# Patient Record
Sex: Female | Born: 1975
Health system: Southern US, Community
[De-identification: ages and names within clinical notes are randomized; demographics above are authoritative.]

## PROBLEM LIST (undated history)

## (undated) DIAGNOSIS — Z8249 Family history of ischemic heart disease and other diseases of the circulatory system: Secondary | ICD-10-CM

## (undated) DIAGNOSIS — B009 Herpesviral infection, unspecified: Secondary | ICD-10-CM

## (undated) DIAGNOSIS — F419 Anxiety disorder, unspecified: Secondary | ICD-10-CM

## (undated) DIAGNOSIS — G43109 Migraine with aura, not intractable, without status migrainosus: Secondary | ICD-10-CM

## (undated) DIAGNOSIS — R030 Elevated blood-pressure reading, without diagnosis of hypertension: Principal | ICD-10-CM

## (undated) DIAGNOSIS — N200 Calculus of kidney: Secondary | ICD-10-CM

## (undated) DIAGNOSIS — R519 Headache, unspecified: Secondary | ICD-10-CM

## (undated) DIAGNOSIS — IMO0001 Reserved for inherently not codable concepts without codable children: Secondary | ICD-10-CM

## (undated) DIAGNOSIS — R51 Headache: Secondary | ICD-10-CM

## (undated) HISTORY — DX: Herpesviral infection, unspecified: B00.9

## (undated) HISTORY — PX: ANTERIOR CRUCIATE LIGAMENT REPAIR: SHX115

## (undated) HISTORY — DX: Headache, unspecified: R51.9

## (undated) HISTORY — DX: Headache: R51

## (undated) HISTORY — DX: Reserved for inherently not codable concepts without codable children: IMO0001

## (undated) HISTORY — DX: Family history of ischemic heart disease and other diseases of the circulatory system: Z82.49

## (undated) HISTORY — DX: Anxiety disorder, unspecified: F41.9

## (undated) HISTORY — DX: Elevated blood-pressure reading, without diagnosis of hypertension: R03.0

## (undated) HISTORY — PX: LEEP: SHX91

## (undated) HISTORY — DX: Calculus of kidney: N20.0

## (undated) HISTORY — DX: Migraine with aura, not intractable, without status migrainosus: G43.109

---

## 1998-04-30 ENCOUNTER — Other Ambulatory Visit: Admission: RE | Admit: 1998-04-30 | Discharge: 1998-04-30 | Payer: Self-pay | Admitting: Gynecology

## 1998-05-25 ENCOUNTER — Ambulatory Visit (HOSPITAL_COMMUNITY): Admission: RE | Admit: 1998-05-25 | Discharge: 1998-05-25 | Payer: Self-pay | Admitting: Gynecology

## 2000-05-09 ENCOUNTER — Emergency Department (HOSPITAL_COMMUNITY): Admission: EM | Admit: 2000-05-09 | Discharge: 2000-05-09 | Payer: Self-pay | Admitting: Emergency Medicine

## 2001-04-15 ENCOUNTER — Other Ambulatory Visit: Admission: RE | Admit: 2001-04-15 | Discharge: 2001-04-15 | Payer: Self-pay | Admitting: Obstetrics and Gynecology

## 2002-04-28 ENCOUNTER — Other Ambulatory Visit: Admission: RE | Admit: 2002-04-28 | Discharge: 2002-04-28 | Payer: Self-pay | Admitting: Obstetrics and Gynecology

## 2002-04-28 ENCOUNTER — Encounter: Admission: RE | Admit: 2002-04-28 | Discharge: 2002-04-28 | Payer: Self-pay | Admitting: Family Medicine

## 2002-04-28 ENCOUNTER — Encounter: Payer: Self-pay | Admitting: Family Medicine

## 2003-05-16 ENCOUNTER — Other Ambulatory Visit: Admission: RE | Admit: 2003-05-16 | Discharge: 2003-05-16 | Payer: Self-pay | Admitting: Obstetrics and Gynecology

## 2004-10-23 ENCOUNTER — Other Ambulatory Visit: Admission: RE | Admit: 2004-10-23 | Discharge: 2004-10-23 | Payer: Self-pay | Admitting: Obstetrics and Gynecology

## 2004-10-26 ENCOUNTER — Emergency Department (HOSPITAL_COMMUNITY): Admission: EM | Admit: 2004-10-26 | Discharge: 2004-10-26 | Payer: Self-pay | Admitting: *Deleted

## 2008-02-04 ENCOUNTER — Ambulatory Visit (HOSPITAL_BASED_OUTPATIENT_CLINIC_OR_DEPARTMENT_OTHER): Admission: RE | Admit: 2008-02-04 | Discharge: 2008-02-04 | Payer: Self-pay | Admitting: Orthopedic Surgery

## 2010-05-13 ENCOUNTER — Inpatient Hospital Stay (HOSPITAL_COMMUNITY): Admission: AD | Admit: 2010-05-13 | Discharge: 2010-05-19 | Payer: Self-pay | Admitting: Obstetrics and Gynecology

## 2010-05-15 ENCOUNTER — Encounter (INDEPENDENT_AMBULATORY_CARE_PROVIDER_SITE_OTHER): Payer: Self-pay | Admitting: Obstetrics and Gynecology

## 2010-05-21 ENCOUNTER — Encounter: Admission: RE | Admit: 2010-05-21 | Discharge: 2010-06-04 | Payer: Self-pay | Admitting: Certified Nurse Midwife

## 2010-11-28 LAB — CBC
HCT: 30.2 % — ABNORMAL LOW (ref 36.0–46.0)
Hemoglobin: 10.2 g/dL — ABNORMAL LOW (ref 12.0–15.0)
MCH: 28.4 pg (ref 26.0–34.0)
MCHC: 33.7 g/dL (ref 30.0–36.0)
MCV: 84.2 fL (ref 78.0–100.0)
Platelets: 259 10*3/uL (ref 150–400)
RBC: 3.59 MIL/uL — ABNORMAL LOW (ref 3.87–5.11)
RDW: 16.7 % — ABNORMAL HIGH (ref 11.5–15.5)
WBC: 13 10*3/uL — ABNORMAL HIGH (ref 4.0–10.5)

## 2010-11-29 LAB — SAMPLE TO BLOOD BANK

## 2010-11-29 LAB — DIFFERENTIAL
Basophils Absolute: 0 10*3/uL (ref 0.0–0.1)
Basophils Relative: 0 % (ref 0–1)
Eosinophils Absolute: 0.1 10*3/uL (ref 0.0–0.7)
Eosinophils Relative: 1 % (ref 0–5)
Lymphocytes Relative: 27 % (ref 12–46)
Lymphs Abs: 3.6 10*3/uL (ref 0.7–4.0)
Monocytes Absolute: 0.9 10*3/uL (ref 0.1–1.0)
Monocytes Relative: 7 % (ref 3–12)
Neutro Abs: 8.7 10*3/uL — ABNORMAL HIGH (ref 1.7–7.7)
Neutrophils Relative %: 65 % (ref 43–77)

## 2010-11-29 LAB — CBC
HCT: 35.4 % — ABNORMAL LOW (ref 36.0–46.0)
Hemoglobin: 11.8 g/dL — ABNORMAL LOW (ref 12.0–15.0)
MCH: 27.9 pg (ref 26.0–34.0)
MCHC: 33.3 g/dL (ref 30.0–36.0)
MCV: 83.6 fL (ref 78.0–100.0)
Platelets: 282 10*3/uL (ref 150–400)
RBC: 4.23 MIL/uL (ref 3.87–5.11)
RDW: 16.5 % — ABNORMAL HIGH (ref 11.5–15.5)
WBC: 13.3 10*3/uL — ABNORMAL HIGH (ref 4.0–10.5)

## 2010-11-29 LAB — RPR: RPR Ser Ql: NONREACTIVE

## 2011-01-28 NOTE — Op Note (Signed)
NAMEEMERLY, PRAK                ACCOUNT NO.:  [id number]   MEDICAL RECORD NO.:  [id number]          PATIENT TYPE:  AMB   LOCATION:  DSC                          FACILITY:  [facility]   PHYSICIAN:  Eulas Post, MD    DATE OF BIRTH:  [birth year]   DATE OF PROCEDURE:  02/04/2008  DATE OF DISCHARGE:                               OPERATIVE REPORT   PREOPERATIVE DIAGNOSIS:  Left knee anterior cruciate ligament tear.   POSTOPERATIVE DIAGNOSIS:  1. Left knee anterior cruciate ligament tear.  2. Left lateral meniscus tear.   OPERATIVE PROCEDURE:  1. Left knee arthroscopy with anterior cruciate ligament      reconstruction using hamstring autograft.  2. Lateral meniscus repair using an Arthrex all-inside technique with      a single suture.   PREOPERATIVE INDICATIONS:  Mrs. Dameisha Tschida is a 35 year old young  woman who tore her ACL while skiing.  She had a preoperative MRI that  did not demonstrate the meniscus tear.  She had an MCL sprain as well.  She elected to undergo the above-named procedures.  The risks, benefits,  and alternatives to operative intervention were discussed with her  preoperatively including but not limited to risks of infection,  bleeding, nerve injury, hardware prominence, hardware failure, the need  for revision ACL surgery, recurrent ACL tear, recurrent meniscal tear,  the possibility for further surgery and/or meniscectomy, cardiopulmonary  complications among others; and she was willing to proceed.   OPERATIVE FINDINGS:  The patellofemoral joint was normal.  The medial  meniscus and medial compartment were normal.  The ACL was torn.  The PCL  was intact.  She has a positive Lachman and a positive drawer.  Her MCL  seemed to be relatively stable on exam under anesthesia.  Her lateral  meniscus had a peripheral tear.  This did not communicate completely to  the superior surface, but it did to the inferior surface.  The lateral  femoral condyle and lateral  tibial condyle were intact.  There was good  bleeding tissue at the site of the meniscus tear.   OPERATIVE PROCEDURE:  The patient was brought to the operating room and  placed in the supine position.  Femoral nerve block was administered.  Left lower extremity was prepped and draped in the usual sterile  fashion.  An exam under anesthesia was performed.  After confirming that  there was no ACL function, we harvested her hamstring graft without  difficulty.  We then performed a knee arthroscopy with the above-named  findings.  We used an all-inside technique to secure the lateral  meniscus.  We did not place 2 stitches because the second stitch would  have been into the popliteal hiatus, and therefore, I was only able to  place a single stitch.  We then performed a notchplasty and drilled our  tibial tunnel to the appropriate position.  All soft tissue was removed  at the ACL footprint.  The hamstring was sized to a 7.5 on the tibia and  a 7.0 on the femur.  The overall length was 180 mm.  We had excellent  tissue to work with.  We used a size 20 Endobutton and a size 28 x 9-mm  bioabsorbable interference tibial screw.  We opened the tibia with a 6  and then dilated to a 7.5.  We reamed the femur to a length of 35 mm  using a size 7 reamer.  The graft passed without difficulty and was  secured on the femoral side.  Appropriate position on the femoral tunnel  was confirmed by direct visualization using the arthroscope.  A 2.5 mm  back wall was left.  The graft had excellent isometry.  We then fixed it  distally on the tibia using the interference screw.  Excellent fixation  was achieved, and the knee was cycled.  The graft was tensioned during  the notchplasty portion of the case.  Wounds were irrigated copiously,  and the tissue closed with Vicryl followed by Monocryl for the skin and  then Steri-Strips.  Sterile gauze was applied and a light compression  wrap.  The patient was awakened  and returned to the PACU in stable and  satisfactory condition.  There were no complications, and the patient  tolerated the procedure well.      Eulas Post, MD  Electronically Signed     JPL/MEDQ  D:  02/04/2008  T:  02/05/2008  Job:  [id number]

## 2011-12-19 LAB — OB RESULTS CONSOLE ANTIBODY SCREEN: Antibody Screen: NEGATIVE

## 2011-12-19 LAB — OB RESULTS CONSOLE ABO/RH: RH Type: POSITIVE

## 2012-05-04 LAB — OB RESULTS CONSOLE RPR: RPR: NONREACTIVE

## 2012-06-15 ENCOUNTER — Encounter (HOSPITAL_COMMUNITY): Payer: Self-pay | Admitting: Pharmacist

## 2012-06-22 ENCOUNTER — Other Ambulatory Visit: Payer: Self-pay | Admitting: Obstetrics & Gynecology

## 2012-06-24 ENCOUNTER — Encounter (HOSPITAL_COMMUNITY): Payer: Self-pay

## 2012-06-25 ENCOUNTER — Encounter (HOSPITAL_COMMUNITY): Payer: Self-pay

## 2012-06-25 ENCOUNTER — Encounter (HOSPITAL_COMMUNITY)
Admission: RE | Admit: 2012-06-25 | Discharge: 2012-06-25 | Disposition: A | Discharge status: Home / Self Care | Payer: BC Managed Care – PPO | Source: Ambulatory Visit | Attending: Obstetrics & Gynecology | Admitting: Obstetrics & Gynecology

## 2012-06-25 LAB — CBC
HCT: 32.3 % — ABNORMAL LOW (ref 36.0–46.0)
MCH: 26.8 pg (ref 26.0–34.0)
MCHC: 33.1 g/dL (ref 30.0–36.0)
MCV: 80.8 fL (ref 78.0–100.0)
Platelets: 234 10*3/uL (ref 150–400)
RDW: 15.4 % (ref 11.5–15.5)
WBC: 10.8 10*3/uL — ABNORMAL HIGH (ref 4.0–10.5)

## 2012-06-25 LAB — TYPE AND SCREEN: ABO/RH(D): O POS

## 2012-06-25 LAB — ABO/RH: ABO/RH(D): O POS

## 2012-06-25 LAB — RPR: RPR Ser Ql: NONREACTIVE

## 2012-06-25 NOTE — Patient Instructions (Signed)
Your procedure is scheduled on:07/02/12  Enter through the Main Entrance at :0800am  Pick up desk phone and dial [phone] and inform us of your arrival.  Please call [phone] if you have any problems the morning of surgery.  Remember: Do not eat after midnight:Thursday Do not drink after:midnight Thursday Take these meds the morning of surgery with a sip of water:none  DO NOT wear jewelry, eye make-up, lipstick,body lotion, or dark fingernail polish. Do not shave for 48 hours prior to surgery.  If you are to be admitted after surgery, leave suitcase in car until your room has been assigned. Patients discharged on the day of surgery will not be allowed to drive home.   Remember to use your Hibiclens as instructed.

## 2012-07-01 NOTE — H&P (Signed)
Ashley Horne is a 36 y.o. female presenting at 36.3 wks with prior Classical c/s at 35 wks  She is here for repeat cesarean section due to prior classical c/s, Mullerian anomaly of uterus (partial septum) and risk of scar rupture in labor.   [facility]- Linnell Fulling, CNM at [facility]. Good care. 1st trim Prometrium, 17-P inj weekly from 16 to 36 wks, Betamethasone at 33 wks for anticipated PTD. Sono noted EIF, no other markers and normal Quad screen.   History OB History    Grav Para Term Preterm Abortions TAB SAB Ect Mult Living   3 1  1 1 1    1     Prior 35 wk, PROM, breech, labor and noted to have Septate uterus, narrow lower segment, needed classical c/s.  Past Medical History  Diagnosis Date  . No pertinent past medical history    Past Surgical History  Procedure Date  . Cesarean section   . Leep    Family History: family history is not on file. Social History:  reports that she has never smoked. She does not have any smokeless tobacco history on file. She reports that she does not drink alcohol or use illicit drugs.   Prenatal Transfer Tool  Maternal Diabetes: No Genetic Screening: Normal Maternal Ultrasounds/Referrals: Abnormal:  Findings:   Isolated EIF (echogenic intracardiac focus) Fetal Ultrasounds or other Referrals:  None Maternal Substance Abuse:  No Significant Maternal Medications:  None Significant Maternal Lab Results:  Lab values include: Group B Strep positive Other Comments:  None  ROS neg     There were no vitals taken for this visit. Exam Physical Exam  Physical exam:  A&O x 3, no acute distress. Pleasant HEENT neg, no thyromegaly Lungs CTA bilat CV RRR, S1S2 normal Abdo soft, non tender, non acute Extr no edema/ tenderness Pelvic deferred FHT  present Toco none  Prenatal labs: ABO, Rh: --/--/O POS (10/11 1610) Antibody: NEG (10/11 1605) Rubella: Immune (04/05 0000) RPR: NON REACTIVE (10/11 1605)  HBsAg: Negative (04/05 0000)  HIV:  Non-reactive (04/05 0000)  GBS:   Positive in urine Quad screen negative   Assessment/Plan: 36 yo, G3P0111, 36.3 wks, prior classical c/s and with anomalous uterus (partial septum) here for repeat c-section.  Risks/complications of surgery reviewed incl infection, bleeding, damage to internal organs including bladder, bowels, ureters, blood vessels, other risks from anesthesia, VTE and delayed complications of any surgery, complications in future surgery reviewed. Also discussed neonatal complications incl difficult delivery, laceration, vacuum assistance, TTN etc. Pt understands and agrees, all concerns addressed.       Ashley Horne 07/01/2012, 8:49 PM

## 2012-07-02 ENCOUNTER — Encounter (HOSPITAL_COMMUNITY): Payer: Self-pay | Admitting: Anesthesiology

## 2012-07-02 ENCOUNTER — Encounter (HOSPITAL_COMMUNITY)
Admission: RE | Disposition: A | Discharge status: Home / Self Care | Payer: Self-pay | Source: Ambulatory Visit | Attending: Obstetrics & Gynecology

## 2012-07-02 ENCOUNTER — Inpatient Hospital Stay (HOSPITAL_COMMUNITY)
Admission: RE | Admit: 2012-07-02 | Discharge: 2012-07-04 | DRG: 370 | Disposition: A | Discharge status: Home / Self Care | Payer: BC Managed Care – PPO | Source: Ambulatory Visit | Attending: Obstetrics & Gynecology | Admitting: Obstetrics & Gynecology

## 2012-07-02 ENCOUNTER — Inpatient Hospital Stay (HOSPITAL_COMMUNITY): Payer: BC Managed Care – PPO

## 2012-07-02 ENCOUNTER — Encounter (HOSPITAL_COMMUNITY): Payer: Self-pay

## 2012-07-02 DIAGNOSIS — Q5128 Other doubling of uterus, other specified: Secondary | ICD-10-CM

## 2012-07-02 DIAGNOSIS — D509 Iron deficiency anemia, unspecified: Secondary | ICD-10-CM | POA: Diagnosis present

## 2012-07-02 DIAGNOSIS — O9902 Anemia complicating childbirth: Secondary | ICD-10-CM | POA: Diagnosis present

## 2012-07-02 DIAGNOSIS — O34219 Maternal care for unspecified type scar from previous cesarean delivery: Principal | ICD-10-CM | POA: Diagnosis present

## 2012-07-02 DIAGNOSIS — Z22330 Carrier of Group B streptococcus: Secondary | ICD-10-CM

## 2012-07-02 DIAGNOSIS — O09529 Supervision of elderly multigravida, unspecified trimester: Secondary | ICD-10-CM | POA: Diagnosis present

## 2012-07-02 DIAGNOSIS — O99892 Other specified diseases and conditions complicating childbirth: Secondary | ICD-10-CM | POA: Diagnosis present

## 2012-07-02 DIAGNOSIS — Z98891 History of uterine scar from previous surgery: Secondary | ICD-10-CM

## 2012-07-02 DIAGNOSIS — O3400 Maternal care for unspecified congenital malformation of uterus, unspecified trimester: Secondary | ICD-10-CM | POA: Diagnosis present

## 2012-07-02 LAB — TYPE AND SCREEN
ABO/RH(D): O POS
Antibody Screen: NEGATIVE

## 2012-07-02 SURGERY — Surgical Case
Anesthesia: Spinal | Site: Abdomen | Wound class: Clean Contaminated

## 2012-07-02 MED ORDER — KETOROLAC TROMETHAMINE 30 MG/ML IJ SOLN: 30.0000 mg | Freq: Four times a day (QID) | INTRAMUSCULAR | Status: DC | PRN

## 2012-07-02 MED ORDER — NALBUPHINE HCL 10 MG/ML IJ SOLN
5.0000 mg | INTRAMUSCULAR | Status: DC | PRN
  Filled 2012-07-02: qty 1

## 2012-07-02 MED ORDER — OXYCODONE-ACETAMINOPHEN 5-325 MG PO TABS
1.0000 | ORAL_TABLET | ORAL | Status: DC | PRN
  Administered 2012-07-03 (×2): 1 via ORAL
  Administered 2012-07-03: 2 via ORAL
  Administered 2012-07-03 – 2012-07-04 (×5): 1 via ORAL
  Filled 2012-07-02 (×4): qty 1
  Filled 2012-07-02: qty 2
  Filled 2012-07-02 (×3): qty 1

## 2012-07-02 MED ORDER — SIMETHICONE 80 MG PO CHEW
80.0000 mg | CHEWABLE_TABLET | Freq: Three times a day (TID) | ORAL | Status: DC
  Administered 2012-07-02 – 2012-07-03 (×5): 80 mg via ORAL

## 2012-07-02 MED ORDER — KETOROLAC TROMETHAMINE 30 MG/ML IJ SOLN
30.0000 mg | Freq: Four times a day (QID) | INTRAMUSCULAR | Status: DC | PRN
  Administered 2012-07-02 (×2): 30 mg via INTRAVENOUS
  Filled 2012-07-02: qty 1

## 2012-07-02 MED ORDER — DIPHENHYDRAMINE HCL 25 MG PO CAPS: 25.0000 mg | ORAL_CAPSULE | Freq: Four times a day (QID) | ORAL | Status: DC | PRN

## 2012-07-02 MED ORDER — MORPHINE SULFATE 0.5 MG/ML IJ SOLN
INTRAMUSCULAR | Status: AC
  Filled 2012-07-02: qty 10

## 2012-07-02 MED ORDER — IBUPROFEN 600 MG PO TABS
600.0000 mg | ORAL_TABLET | Freq: Four times a day (QID) | ORAL | Status: DC | PRN
  Filled 2012-07-02 (×3): qty 1

## 2012-07-02 MED ORDER — NALOXONE HCL 0.4 MG/ML IJ SOLN
1.0000 ug/kg/h | INTRAMUSCULAR | Status: DC | PRN
  Filled 2012-07-02: qty 2.5

## 2012-07-02 MED ORDER — FENTANYL CITRATE 0.05 MG/ML IJ SOLN
INTRAMUSCULAR | Status: DC | PRN
  Administered 2012-07-02: 25 ug via INTRATHECAL

## 2012-07-02 MED ORDER — MORPHINE SULFATE (PF) 0.5 MG/ML IJ SOLN
INTRAMUSCULAR | Status: DC | PRN
  Administered 2012-07-02: 4.85 mg via INTRAVENOUS

## 2012-07-02 MED ORDER — SIMETHICONE 80 MG PO CHEW: 80.0000 mg | CHEWABLE_TABLET | ORAL | Status: DC | PRN

## 2012-07-02 MED ORDER — SENNOSIDES-DOCUSATE SODIUM 8.6-50 MG PO TABS
2.0000 | ORAL_TABLET | Freq: Every day | ORAL | Status: DC
  Administered 2012-07-02 – 2012-07-03 (×2): 2 via ORAL

## 2012-07-02 MED ORDER — MEPERIDINE HCL 25 MG/ML IJ SOLN: 6.2500 mg | INTRAMUSCULAR | Status: DC | PRN

## 2012-07-02 MED ORDER — DIPHENHYDRAMINE HCL 25 MG PO CAPS
25.0000 mg | ORAL_CAPSULE | ORAL | Status: DC | PRN
  Administered 2012-07-02 – 2012-07-03 (×3): 25 mg via ORAL
  Filled 2012-07-02 (×3): qty 1

## 2012-07-02 MED ORDER — ONDANSETRON HCL 4 MG/2ML IJ SOLN
INTRAMUSCULAR | Status: DC | PRN
  Administered 2012-07-02: 4 mg via INTRAVENOUS

## 2012-07-02 MED ORDER — CEFAZOLIN SODIUM-DEXTROSE 2-3 GM-% IV SOLR
INTRAVENOUS | Status: AC
  Filled 2012-07-02: qty 50

## 2012-07-02 MED ORDER — BUPIVACAINE IN DEXTROSE 0.75-8.25 % IT SOLN
INTRATHECAL | Status: DC | PRN
  Administered 2012-07-02: 11.5 mg via INTRATHECAL

## 2012-07-02 MED ORDER — SCOPOLAMINE 1 MG/3DAYS TD PT72
MEDICATED_PATCH | TRANSDERMAL | Status: AC
  Administered 2012-07-02: 1.5 mg via TRANSDERMAL
  Filled 2012-07-02: qty 1

## 2012-07-02 MED ORDER — FENTANYL CITRATE 0.05 MG/ML IJ SOLN
INTRAMUSCULAR | Status: DC | PRN
  Administered 2012-07-02: 75 ug via INTRAVENOUS

## 2012-07-02 MED ORDER — KETOROLAC TROMETHAMINE 30 MG/ML IJ SOLN
INTRAMUSCULAR | Status: AC
  Administered 2012-07-02: 30 mg via INTRAVENOUS
  Filled 2012-07-02: qty 1

## 2012-07-02 MED ORDER — OXYTOCIN 40 UNITS IN LACTATED RINGERS INFUSION - SIMPLE MED: 62.5000 mL/h | INTRAVENOUS | Status: AC

## 2012-07-02 MED ORDER — ONDANSETRON HCL 4 MG/2ML IJ SOLN
INTRAMUSCULAR | Status: AC
  Filled 2012-07-02: qty 2

## 2012-07-02 MED ORDER — OXYTOCIN 10 UNIT/ML IJ SOLN
INTRAMUSCULAR | Status: AC
  Filled 2012-07-02: qty 4

## 2012-07-02 MED ORDER — DIPHENHYDRAMINE HCL 50 MG/ML IJ SOLN: 25.0000 mg | INTRAMUSCULAR | Status: DC | PRN

## 2012-07-02 MED ORDER — PRENATAL MULTIVITAMIN CH
1.0000 | ORAL_TABLET | Freq: Every day | ORAL | Status: DC
  Administered 2012-07-03 – 2012-07-04 (×2): 1 via ORAL
  Filled 2012-07-02 (×2): qty 1

## 2012-07-02 MED ORDER — MORPHINE SULFATE (PF) 0.5 MG/ML IJ SOLN
INTRAMUSCULAR | Status: DC | PRN
  Administered 2012-07-02: .15 mg via INTRATHECAL

## 2012-07-02 MED ORDER — NALOXONE HCL 0.4 MG/ML IJ SOLN: 0.4000 mg | INTRAMUSCULAR | Status: DC | PRN

## 2012-07-02 MED ORDER — ONDANSETRON HCL 4 MG/2ML IJ SOLN: 4.0000 mg | Freq: Three times a day (TID) | INTRAMUSCULAR | Status: DC | PRN

## 2012-07-02 MED ORDER — METOCLOPRAMIDE HCL 5 MG/ML IJ SOLN: 10.0000 mg | Freq: Three times a day (TID) | INTRAMUSCULAR | Status: DC | PRN

## 2012-07-02 MED ORDER — ONDANSETRON HCL 4 MG/2ML IJ SOLN: 4.0000 mg | INTRAMUSCULAR | Status: DC | PRN

## 2012-07-02 MED ORDER — DIPHENHYDRAMINE HCL 50 MG/ML IJ SOLN
12.5000 mg | INTRAMUSCULAR | Status: DC | PRN
  Administered 2012-07-02: 12.5 mg via INTRAVENOUS

## 2012-07-02 MED ORDER — MENTHOL 3 MG MT LOZG: 1.0000 | LOZENGE | OROMUCOSAL | Status: DC | PRN

## 2012-07-02 MED ORDER — FENTANYL CITRATE 0.05 MG/ML IJ SOLN: 25.0000 ug | INTRAMUSCULAR | Status: DC | PRN

## 2012-07-02 MED ORDER — ONDANSETRON HCL 4 MG PO TABS: 4.0000 mg | ORAL_TABLET | ORAL | Status: DC | PRN

## 2012-07-02 MED ORDER — OXYTOCIN 10 UNIT/ML IJ SOLN
40.0000 [IU] | INTRAVENOUS | Status: DC | PRN
  Administered 2012-07-02: 40 [IU] via INTRAVENOUS

## 2012-07-02 MED ORDER — LANOLIN HYDROUS EX OINT: 1.0000 "application " | TOPICAL_OINTMENT | CUTANEOUS | Status: DC | PRN

## 2012-07-02 MED ORDER — WITCH HAZEL-GLYCERIN EX PADS: 1.0000 "application " | MEDICATED_PAD | CUTANEOUS | Status: DC | PRN

## 2012-07-02 MED ORDER — CEFAZOLIN SODIUM-DEXTROSE 2-3 GM-% IV SOLR
2.0000 g | INTRAVENOUS | Status: AC
  Administered 2012-07-02: 2 g via INTRAVENOUS

## 2012-07-02 MED ORDER — 0.9 % SODIUM CHLORIDE (POUR BTL) OPTIME
TOPICAL | Status: DC | PRN
  Administered 2012-07-02: 1000 mL

## 2012-07-02 MED ORDER — LACTATED RINGERS IV SOLN
Freq: Once | INTRAVENOUS | Status: AC
  Administered 2012-07-02 (×4): via INTRAVENOUS

## 2012-07-02 MED ORDER — DIBUCAINE 1 % RE OINT: 1.0000 "application " | TOPICAL_OINTMENT | RECTAL | Status: DC | PRN

## 2012-07-02 MED ORDER — TETANUS-DIPHTH-ACELL PERTUSSIS 5-2.5-18.5 LF-MCG/0.5 IM SUSP
0.5000 mL | Freq: Once | INTRAMUSCULAR | Status: AC
  Administered 2012-07-03: 0.5 mL via INTRAMUSCULAR
  Filled 2012-07-02: qty 0.5

## 2012-07-02 MED ORDER — DIPHENHYDRAMINE HCL 50 MG/ML IJ SOLN
INTRAMUSCULAR | Status: AC
  Filled 2012-07-02: qty 1

## 2012-07-02 MED ORDER — ZOLPIDEM TARTRATE 5 MG PO TABS: 5.0000 mg | ORAL_TABLET | Freq: Every evening | ORAL | Status: DC | PRN

## 2012-07-02 MED ORDER — IBUPROFEN 600 MG PO TABS
600.0000 mg | ORAL_TABLET | Freq: Four times a day (QID) | ORAL | Status: DC
  Administered 2012-07-03 – 2012-07-04 (×7): 600 mg via ORAL
  Filled 2012-07-02 (×4): qty 1

## 2012-07-02 MED ORDER — FENTANYL CITRATE 0.05 MG/ML IJ SOLN
INTRAMUSCULAR | Status: AC
  Filled 2012-07-02: qty 2

## 2012-07-02 MED ORDER — SCOPOLAMINE 1 MG/3DAYS TD PT72
1.0000 | MEDICATED_PATCH | Freq: Once | TRANSDERMAL | Status: DC
  Administered 2012-07-02: 1.5 mg via TRANSDERMAL

## 2012-07-02 MED ORDER — LACTATED RINGERS IV SOLN
INTRAVENOUS | Status: DC
  Administered 2012-07-03: via INTRAVENOUS

## 2012-07-02 MED ORDER — SODIUM CHLORIDE 0.9 % IJ SOLN: 3.0000 mL | INTRAMUSCULAR | Status: DC | PRN

## 2012-07-02 SURGICAL SUPPLY — 52 items
APL SKNCLS STERI-STRIP NONHPOA (GAUZE/BANDAGES/DRESSINGS) ×1
BARRIER ADHS 3X4 INTERCEED (GAUZE/BANDAGES/DRESSINGS) ×1 IMPLANT
BENZOIN TINCTURE PRP APPL 2/3 (GAUZE/BANDAGES/DRESSINGS) ×1 IMPLANT
BRR ADH 4X3 ABS CNTRL BYND (GAUZE/BANDAGES/DRESSINGS) ×1
CLOTH BEACON ORANGE TIMEOUT ST (SAFETY) ×2 IMPLANT
CONTAINER PREFILL 10% NBF 15ML (MISCELLANEOUS) IMPLANT
DRAPE SURG 17X23 STRL (DRAPES) ×2 IMPLANT
DRESSING TELFA 8X3 (GAUZE/BANDAGES/DRESSINGS) ×2 IMPLANT
DRSG COVADERM 4X10 (GAUZE/BANDAGES/DRESSINGS) IMPLANT
DURAPREP 26ML APPLICATOR (WOUND CARE) ×2 IMPLANT
ELECT REM PT RETURN 9FT ADLT (ELECTROSURGICAL) ×2
ELECTRODE REM PT RTRN 9FT ADLT (ELECTROSURGICAL) ×1 IMPLANT
EXTRACTOR VACUUM KIWI (MISCELLANEOUS) IMPLANT
EXTRACTOR VACUUM M CUP 4 TUBE (SUCTIONS) IMPLANT
GAUZE SPONGE 4X4 12PLY STRL LF (GAUZE/BANDAGES/DRESSINGS) ×4 IMPLANT
GLOVE BIO SURGEON STRL SZ7 (GLOVE) ×2 IMPLANT
GLOVE BIOGEL M 6.5 STRL (GLOVE) ×3 IMPLANT
GLOVE BIOGEL PI IND STRL 6.5 (GLOVE) IMPLANT
GLOVE BIOGEL PI IND STRL 7.0 (GLOVE) ×2 IMPLANT
GLOVE BIOGEL PI INDICATOR 6.5 (GLOVE) ×2
GLOVE BIOGEL PI INDICATOR 7.0 (GLOVE) ×4
GLOVE ECLIPSE 6.0 STRL STRAW (GLOVE) ×1 IMPLANT
GLOVE ECLIPSE 6.5 STRL STRAW (GLOVE) ×1 IMPLANT
GOWN PREVENTION PLUS LG XLONG (DISPOSABLE) ×6 IMPLANT
KIT ABG SYR 3ML LUER SLIP (SYRINGE) IMPLANT
NDL HYPO 25X5/8 SAFETYGLIDE (NEEDLE) IMPLANT
NEEDLE HYPO 25X5/8 SAFETYGLIDE (NEEDLE) IMPLANT
NS IRRIG 1000ML POUR BTL (IV SOLUTION) ×2 IMPLANT
PACK C SECTION WH (CUSTOM PROCEDURE TRAY) ×2 IMPLANT
PAD ABD 7.5X8 STRL (GAUZE/BANDAGES/DRESSINGS) ×2 IMPLANT
PAD OB MATERNITY 4.3X12.25 (PERSONAL CARE ITEMS) IMPLANT
RTRCTR C-SECT PINK 25CM LRG (MISCELLANEOUS) IMPLANT
SLEEVE SCD COMPRESS KNEE MED (MISCELLANEOUS) ×1 IMPLANT
STAPLER VISISTAT 35W (STAPLE) IMPLANT
STRIP CLOSURE SKIN 1/2X4 (GAUZE/BANDAGES/DRESSINGS) ×1 IMPLANT
STRIP CLOSURE SKIN 1/4X4 (GAUZE/BANDAGES/DRESSINGS) IMPLANT
SUT MNCRL 0 VIOLET CTX 36 (SUTURE) ×3 IMPLANT
SUT MONOCRYL 0 CTX 36 (SUTURE) ×3
SUT PLAIN 0 NONE (SUTURE) IMPLANT
SUT PLAIN 2 0 (SUTURE)
SUT PLAIN ABS 2-0 CT1 27XMFL (SUTURE) IMPLANT
SUT PROLENE 6 0 C 1 24 (SUTURE) ×1 IMPLANT
SUT VIC AB 0 CT1 27 (SUTURE) ×4
SUT VIC AB 0 CT1 27XBRD ANBCTR (SUTURE) ×2 IMPLANT
SUT VIC AB 2-0 CT1 27 (SUTURE) ×4
SUT VIC AB 2-0 CT1 TAPERPNT 27 (SUTURE) ×2 IMPLANT
SUT VIC AB 4-0 KS 27 (SUTURE) ×1 IMPLANT
SUT VICRYL 0 TIES 12 18 (SUTURE) IMPLANT
TAPE CLOTH SURG 4X10 WHT LF (GAUZE/BANDAGES/DRESSINGS) ×1 IMPLANT
TOWEL OR 17X24 6PK STRL BLUE (TOWEL DISPOSABLE) ×4 IMPLANT
TRAY FOLEY CATH 14FR (SET/KITS/TRAYS/PACK) ×1 IMPLANT
WATER STERILE IRR 1000ML POUR (IV SOLUTION) ×2 IMPLANT

## 2012-07-02 NOTE — Addendum Note (Signed)
Addendum  created 07/02/12 1721 by Armanda Heritage, RN   Modules edited:Charges VN, Notes Section

## 2012-07-02 NOTE — Interval H&P Note (Signed)
History and Physical Interval Note:  07/02/2012 8:32 AM  Ashley Horne  has presented today for surgery, with the diagnosis of Previous classical cesarean section, Partial uterine septum  The various methods of treatment have been discussed with the patient and family. After consideration of risks, benefits and other options for treatment, the patient has consented to  Procedure(s) (LRB) with comments: CESAREAN SECTION (N/A) - EDD: 07/27/12/Repeat as a surgical intervention .  The patient's history has been reviewed, patient examined, no change in status, stable for surgery.  I have reviewed the patient's chart and labs.  Questions were answered to the patient's satisfaction.     Ashley Horne R  07/02/12 8.30 am Pt seen, no new changes, plan reviewed, proceed with repeat c/section.  Risks/complications of surgery reviewed incl infection, bleeding, damage to internal organs including bladder, bowels, ureters, blood vessels, other risks from anesthesia, VTE and delayed complications of any surgery, complications in future surgery reviewed. Also discussed neonatal complications incl difficult delivery, laceration, vacuum assistance, TTN etc. Pt understands and agrees, all concerns addressed.    V.Ashley Friedl, MD

## 2012-07-02 NOTE — OR Nursing (Signed)
1115 Interceed applied intraoperatively by Dr. Juliene Pina.

## 2012-07-02 NOTE — Anesthesia Preprocedure Evaluation (Signed)
Anesthesia Evaluation  Patient identified by MRN, date of birth, ID band Patient awake    Reviewed: Allergy & Precautions, H&P , NPO status , Patient's Chart, lab work & pertinent test results  Airway Mallampati: III TM Distance: >3 FB Neck ROM: Full    Dental No notable dental hx. (+) Teeth Intact   Pulmonary neg pulmonary ROS,  breath sounds clear to auscultation  Pulmonary exam normal       Cardiovascular negative cardio ROS  Rhythm:Regular Rate:Normal     Neuro/Psych negative neurological ROS  negative psych ROS   GI/Hepatic negative GI ROS, Neg liver ROS,   Endo/Other  negative endocrine ROS  Renal/GU negative Renal ROS     Musculoskeletal   Abdominal Normal abdominal exam  (+)   Peds  Hematology negative hematology ROS (+)   Anesthesia Other Findings   Reproductive/Obstetrics (+) Pregnancy                           Anesthesia Physical Anesthesia Plan  ASA: II  Anesthesia Plan: Spinal   Post-op Pain Management:    Induction:   Airway Management Planned:   Additional Equipment:   Intra-op Plan:   Post-operative Plan:   Informed Consent: I have reviewed the patients History and Physical, chart, labs and discussed the procedure including the risks, benefits and alternatives for the proposed anesthesia with the patient or authorized representative who has indicated his/her understanding and acceptance.   Dental Advisory Given  Plan Discussed with: Anesthesiologist, CRNA and Surgeon  Anesthesia Plan Comments:         Anesthesia Quick Evaluation

## 2012-07-02 NOTE — Anesthesia Postprocedure Evaluation (Signed)
Anesthesia Post Note  Patient: Ashley Horne  Procedure(s) Performed: Procedure(s) (LRB): CESAREAN SECTION (N/A)  Anesthesia type: Spinal  Patient location: PACU  Post pain: Pain level controlled  Post assessment: Post-op Vital signs reviewed  Last Vitals:  Filed Vitals:   07/02/12 1215  BP: 121/87  Pulse: 72  Temp:   Resp: 16    Post vital signs: Reviewed  Level of consciousness: awake  Complications: No apparent anesthesia complications

## 2012-07-02 NOTE — Op Note (Signed)
Repeat Classical Cesarean Section Procedure Note 07/02/2012  [name]  Indications: Prior Classical Cesarean section   Pre-operative Diagnosis: Previous classical cesarean section, Partial uterine septum. 36.3 wks   Post-operative Diagnosis: Same   Surgeon: Robley Fries, MD  Assistants: Marlinda Mike, CNM  Anesthesia: Spinal   Procedure Details:  The patient was seen in the Holding Room. The risks, benefits, complications, treatment options, and expected outcomes were discussed with the patient. The patient concurred with the proposed plan, giving informed consent. identified as Ashley Horne and the procedure verified as C-Section Delivery. A Time Out was held and the above information confirmed. 2 gm Ancef given.  After induction of anesthesia, the patient was draped and prepped in the usual sterile manner. A Pfanenstiel Incision  was made and carried down through the subcutaneous tissue to the fascia. Extensive scarring encountered. Fascial incision was made and extended transversely. The fascia was separated from the underlying rectus tissue superiorly and inferiorly. The peritoneum was identified and entered. Peritoneal incision was extended longitudinally. Anterior uterine adhesions to peritoneum were released by sharp incisions and bladder moved down. Lower segment was noted to be narrow and a window noted in lower to mid part of the anterior wall of the uterus. Low vertical incision was started but converted to CLASSICAL cesarean incision. Amniotomy revealed clear fluid. Delivered from cephalic presentation was a Female infant without difficulty, Bulb suction of mouth/nose done, cord clamped and cut and handed to NICU team. Vigorous cry noted at birth. Apgars 8 and 8 at 1 and 5 min. Cord blood was obtained for evaluation. The placenta was removed Intact and appeared normal. The uterine septum at fundus noted. Both tubes and ovaries appeared normal. The uterine incision was closed  with running locked sutures of 0Vicryl followed by imbricating layer and then serosal layer closure with 2-0 Vicryl. Hemostasis was observed. Peritoneal gutters cleaned. Interceed placed on uterine incision. Peritoneum closed with 2-0 Vicryl. Muscles approximated in midline.  The fascia was then reapproximated with running sutures of 0Vicryl. The subcuticular closure was performed using 2-0plain gut. The skin was closed with 3-0Vicryl in subcuticular fashion, Steristrips applied and pressure dressing placed.   Instrument, sponge, and needle counts were correct prior the abdominal closure and were correct at the conclusion of the case.    Findings: Female infant born at 11.03 am, Vtx, Apgars 8/8 at 1/5 min. Normal placenta, ovaries, tubes. 3 vessel cord. Partial uterine septum palpated.    Estimated Blood Loss: 700 cc  Total IV Fluids: 3200 ml   Urine Output: 375 cc, clear  Specimens: Cord blood  Complications: None  Disposition: PACU - hemodynamically stable.   Maternal Condition: stable   Baby condition / location:  with mother  Attending Attestation: I performed the procedure.   Signed: Surgeon(s): Robley Fries, MD

## 2012-07-02 NOTE — Anesthesia Postprocedure Evaluation (Signed)
  Anesthesia Post-op Note  Patient: Ashley Horne  Procedure(s) Performed: Procedure(s) (LRB) with comments: CESAREAN SECTION (N/A) - EDD: 07/27/12/Repeat  Patient Location: PACU and Mother/Baby  Anesthesia Type: Spinal  Level of Consciousness: awake, alert  and oriented  Airway and Oxygen Therapy: Patient Spontanous Breathing  Post-op Pain: mild  Post-op Assessment: Post-op Vital signs reviewed  Post-op Vital Signs: Reviewed and stable  Complications: No apparent anesthesia complications

## 2012-07-02 NOTE — Anesthesia Procedure Notes (Signed)
Spinal  Patient location during procedure: OR Start time: 07/02/2012 10:32 AM Staffing Anesthesiologist: Keenya Matera A. Performed by: anesthesiologist  Preanesthetic Checklist Completed: patient identified, site marked, surgical consent, pre-op evaluation, timeout performed, IV checked, risks and benefits discussed and monitors and equipment checked Spinal Block Patient position: sitting Prep: site prepped and draped and DuraPrep Patient monitoring: heart rate, cardiac monitor, continuous pulse ox and blood pressure Approach: midline Location: L3-4 Injection technique: single-shot Needle Needle type: Sprotte  Needle gauge: 24 G Needle length: 9 cm Assessment Sensory level: T4 Events: paresthesia Additional Notes Patient tolerated procedure well. Transient paresthesia left foot. Adequate sensory level.

## 2012-07-02 NOTE — Transfer of Care (Signed)
Immediate Anesthesia Transfer of Care Note  Patient: Ashley Horne  Procedure(s) Performed: Procedure(s) (LRB) with comments: CESAREAN SECTION (N/A) - EDD: 07/27/12/Repeat  Patient Location: PACU  Anesthesia Type: Spinal  Level of Consciousness: awake  Airway & Oxygen Therapy: Patient Spontanous Breathing  Post-op Assessment: Report given to PACU RN and Post -op Vital signs reviewed and stable  Post vital signs: stable  Complications: No apparent anesthesia complications

## 2012-07-03 ENCOUNTER — Encounter (HOSPITAL_COMMUNITY): Payer: Self-pay | Admitting: *Deleted

## 2012-07-03 DIAGNOSIS — Z98891 History of uterine scar from previous surgery: Secondary | ICD-10-CM

## 2012-07-03 LAB — CBC
Hemoglobin: 9.6 g/dL — ABNORMAL LOW (ref 12.0–15.0)
MCHC: 33 g/dL (ref 30.0–36.0)
RDW: 15.5 % (ref 11.5–15.5)
WBC: 9.5 10*3/uL (ref 4.0–10.5)

## 2012-07-03 MED ORDER — POLYSACCHARIDE IRON COMPLEX 150 MG PO CAPS
150.0000 mg | ORAL_CAPSULE | Freq: Every day | ORAL | Status: DC
  Administered 2012-07-04: 150 mg via ORAL
  Filled 2012-07-03: qty 1

## 2012-07-03 NOTE — Progress Notes (Signed)
POSTOPERATIVE DAY # 1 S/P repeat classical cesarean section   S:         Reports feeling well             Tolerating po intake / no nausea / no vomiting / no flatus / no BM             Bleeding is light             Pain controlled with long-acting narcotic / motrin and percocet this am             Up ad lib / ambulatory/ voiding QS  Newborn breast-feeding well    O:  VS: Temp:  [98 F (36.7 C)-99.3 F (37.4 C)] 98.5 F (36.9 C) (10/19 1030) Pulse Rate:  [66-91] 91  (10/19 1030) Resp:  [16-20] 18  (10/19 1030) BP: (102-124)/(57-87) 124/70 mmHg (10/19 1030) SpO2:  [95 %-99 %] 98 % (10/19 1030)   LABS:  Basename 07/03/12 0545  WBC 9.5  HGB 9.6*  PLT 215     Pre-op labs: 10.8 - 10.7 - 32.2 - 234 - iron deficiency anemia of pregnancy   O positive   Rubella - immune              Alert and Oriented X3  Lungs: Clear and unlabored  Heart: regular rate and rhythm / no mumurs  Abdomen: soft, non-tender, slightly distended, hypoactive BS             Fundus: firm, non-tender, U-2             Dressing intact             Lochia: light  Extremities: no edema, no calf pain or tenderness, negative Homans  A:        POD # 1 S/P repeat classical cesarean section             Iron deficiency of pregnancy - delivered             P:        Routine postoperative care              Plan early discharge tomorrow    Marlinda Mike CNM, MSN 07/03/2012, 11:52 AM

## 2012-07-04 MED ORDER — DOCUSATE SODIUM 100 MG PO CAPS: 100.0000 mg | ORAL_CAPSULE | Freq: Two times a day (BID) | ORAL | Status: DC | PRN

## 2012-07-04 MED ORDER — OXYCODONE-ACETAMINOPHEN 5-325 MG PO TABS: 1.0000 | ORAL_TABLET | ORAL | Status: DC | PRN

## 2012-07-04 MED ORDER — POLYSACCHARIDE IRON COMPLEX 150 MG PO CAPS: 150.0000 mg | ORAL_CAPSULE | Freq: Every day | ORAL | Status: DC

## 2012-07-04 MED ORDER — IBUPROFEN 600 MG PO TABS: 600.0000 mg | ORAL_TABLET | Freq: Four times a day (QID) | ORAL | Status: DC | PRN

## 2012-07-04 NOTE — Discharge Summary (Signed)
POSTOPERATIVE DISCHARGE SUMMARY:  Patient ID: Ashley Horne MRN: [id number] DOB/AGE: [id number] 37 y.o.  Admit date: 07/02/2012 Admission Diagnoses: 36 weeks previous classical cesarean section / uterine septum / previous preterm birth (PPROM at 34 weeks) / iron deficiency anemia of pregnancy    Discharge date:  07/04/2012 Discharge Diagnoses: POD #2 s/p repeat classical cesarean section / uterine septum / iron deficiency anemia - stable  Prenatal history: [id number]   EDC : 07/27/2012, by Other Basis  Prenatal care at [facility]  Primary provider : Marlinda Mike CNM Prenatal course complicated by AMA / previous PPROM and PTB / classical cesarean section / uterine septum / iron deficiency anemia of pregnancy.  Progesterone until 15 weeks the 17P from 15 weeks until 36 weeks.  BMZ course at 33 weeks for planned early CS at 36-37 weeks.   Prenatal Labs: ABO, Rh: O (04/05 0000)  Antibody: NEG (10/18 0808) Rubella: Immune (04/05 0000)  RPR: NON REACTIVE (10/11 1605)  HBsAg: Negative (04/05 0000)  HIV: Non-reactive (04/05 0000)  GBS:   negative 1 hr Glucola : Normal   Medical / Surgical History :  Past medical history:  Past Medical History  Diagnosis Date  . No pertinent past medical history     Past surgical history:  Past Surgical History  Procedure Date  . Cesarean section   . Leep     Family History: No family history on file.  Social History:  reports that she has never smoked. She does not have any smokeless tobacco history on file. She reports that she does not drink alcohol or use illicit drugs.   Allergies: Review of patient's allergies indicates no known allergies.    Current Medications at time of admission:  Prior to Admission medications   Medication Sig Start Date End Date Taking? Authorizing Provider  Prenat-FeFum-FePo-FA-Omega 3 (CONCEPT DHA) 53.5-38-1 MG CAPS Take 1 capsule by mouth daily.    Historical Provider, MD  valACYclovir  (VALTREX) 500 MG tablet Take 500 mg by mouth daily as needed. Pt takes only during an Biomedical scientist, MD    Procedures: Cesarean section - repeat classical incision with delivery of female newborn by Dr Juliene Pina  See operative report for further details APGAR (1 MIN): 8   APGAR (5 MINS): 8    Baby weight: 6lbs and 15 oz  Postoperative / postpartum course: uneventful with discharge on POD #2  Physical Exam:   VSS: Temp:  [98.4 F (36.9 C)-99.1 F (37.3 C)] 99.1 F (37.3 C) (10/20 0905) Pulse Rate:  [66-90] 81  (10/20 0905) Resp:  [18-20] 20  (10/20 0905) BP: (107-126)/(62-84) 113/84 mmHg (10/20 0905)  LABS: preop HGB 10.2   Basename 07/03/12 0545  WBC 9.5  HGB 9.6*  PLT 215    General: pleasant / NAD Heart:RRR Lungs: clear Abdomen: Active BS / non-tender / non-distended / uterus firm and non-tender Extremities: trace dependent edema  Incision:  approximated with subcuticular & steri-strips / no erythema / no ecchymosis / no drainage  Discharge Instructions:  Discharged Condition: stable Activity: pelvic rest and postoperative restrictions x 2 weeks Diet: routine Medications: see below:   Medication List     As of 07/04/2012 11:47 AM    STOP taking these medications         hydroxyprogesterone caproate 250 mg/mL Oil   Commonly known as: DELALUTIN      TAKE these medications         CONCEPT DHA 53.5-38-1 MG  Caps   Take 1 capsule by mouth daily.      docusate sodium 100 MG capsule   Commonly known as: COLACE   Take 1 capsule (100 mg total) by mouth 2 (two) times daily as needed for constipation.      ibuprofen 600 MG tablet   Commonly known as: ADVIL,MOTRIN   Take 1 tablet (600 mg total) by mouth every 6 (six) hours as needed.      iron polysaccharides 150 MG capsule   Commonly known as: NIFEREX   Take 1 capsule (150 mg total) by mouth daily.      oxyCODONE-acetaminophen 5-325 MG per tablet   Commonly known as: PERCOCET/ROXICET   Take  1-2 tablets by mouth every 4 (four) hours as needed (moderate - severe pain).      valACYclovir 500 MG tablet   Commonly known as: VALTREX   Take 500 mg by mouth daily as needed. Pt takes only during an outbreak        Postpartum Instructions: [facility] discharge booklet - instructions reviewed Discharge to: Home  Follow up :   [facility] in 6 weeks for routine postpartum visit    Signed: Marlinda Mike CNM, MSN 07/04/2012, 11:47 AM

## 2012-07-04 NOTE — Progress Notes (Signed)
POSTOPERATIVE DAY # 2 S/P classical cesarean section   S:         Reports feeling well - ready to go home             Tolerating po intake / no  nausea / no vomiting / + flatus / no BM             Bleeding is light             Pain controlled with motrin and percocet             Up ad lib / ambulatory/ voiding QS  Newborn breast-feeding   O:  VS: BP 113/84  Pulse 81  Temp 99.1 F (37.3 C) (Oral)  Resp 20  Wt 82.555 kg (182 lb)  SpO2 98%  Breastfeeding? Unknown   LABS:  Basename 07/03/12 0545  WBC 9.5  HGB 9.6*  PLT 215                           I&O:                          I/O last 3 completed shifts: In: 1612.1 [P.O.:360; I.V.:1252.1] Out: 1400 [Urine:1400]             Physical Exam:             Alert and Oriented X3  Lungs: Clear and unlabored  Heart: regular rate and rhythm / no mumurs  Abdomen: soft, non-tender, non-distended, active bowel sounds             Fundus: firm, non-tender, U-2             Dressing OFF              Incision:  approximated with subcuticular & steri-strips / no erythema / no ecchymosis / no drainage  Perineum: no edema  Lochia: light  Extremities: trace edema, no calf pain or tenderness, negative Homans  A:        POD # 2 S/P classical cesarean section - repeat            Mild iron deficiency anemia  P:        Routine postoperative care              Discharge to home              Iron and colace x 6 weeks with PNV    Marlinda Mike CNM, MSN 07/04/2012, 11:42 AM

## 2012-07-04 NOTE — Discharge Summary (Signed)
Reviewed and agree with note V.Drago Hammonds, MD 

## 2012-07-05 ENCOUNTER — Encounter (HOSPITAL_COMMUNITY): Payer: Self-pay | Admitting: Obstetrics & Gynecology

## 2012-07-08 ENCOUNTER — Ambulatory Visit (HOSPITAL_COMMUNITY)
Admission: RE | Admit: 2012-07-08 | Discharge: 2012-07-08 | Disposition: A | Discharge status: Home / Self Care | Payer: BC Managed Care – PPO | Source: Ambulatory Visit | Attending: Certified Nurse Midwife | Admitting: Certified Nurse Midwife

## 2012-07-08 NOTE — Progress Notes (Signed)
Adult Lactation Consultation Outpatient Visit Note  Patient Name: Modelle Mcarthur Date of Birth: [birth year] Gestational Age at Delivery: [redacted]w[redacted]d BW 6+14, DC 6+5, 10/23 6+8 at the ped. Type of Delivery: c-section  Breastfeeding History: Frequency of Breastfeeding:  Length of Feeding:  Voids:  Stools:   Supplementing / Method: Pumping:  Type of Pump:   Frequency:  Volume:    Comments:    Consultation Evaluation:  Initial Feeding Assessment: Pre-feed Weight:3002 Post-feed Weight:3060 Amount Transferred:58 ml Comments: Mom is here today because she has a split in her Rt nipple.  With her last baby her midwife told her it could use a stitch but that it would break open again.  Milk drips heavily from the right nipple once the MER occurs.  Mom fed her last baby for 9 months with this condition.  On Tuesday when the nipple broke open Victory Dakin started having difficulty feeding and her mother felt that she was choking.  Mom started  A NS to slow the flow, to decrease her pain and hopefully help it to heal.  Victory Dakin would not latch to the breast for this LC.  Her latch with the NS was just on the tip.  She was humping the front of her tongue which this LC suspects was done in a effort to protect her airway initially.  LC used tongue exercises to help relax the front of the tongue.  A few drops of Expressed BM were put into her mouth and she latched and fed well in a laid back nursing position.    Total Breast milk Transferred this Visit: 58 Total Supplement Given:   Additional Interventions: Plan is to Use a laid back position to feed. Perform tongue exercises if necessary  Attempt to feed on both sides.  If unable to feed on the right it will be expressed to maintain MS. Comfort gels to the broken area. Follow-up if no improvement.  Follow-Up  prn    Soyla Dryer 07/08/2012, 3:59 PM

## 2014-07-17 ENCOUNTER — Encounter (HOSPITAL_COMMUNITY): Payer: Self-pay | Admitting: Obstetrics & Gynecology

## 2015-03-12 ENCOUNTER — Encounter (HOSPITAL_COMMUNITY): Payer: Self-pay | Admitting: Emergency Medicine

## 2015-03-12 DIAGNOSIS — R197 Diarrhea, unspecified: Secondary | ICD-10-CM | POA: Insufficient documentation

## 2015-03-12 DIAGNOSIS — Z3202 Encounter for pregnancy test, result negative: Secondary | ICD-10-CM | POA: Insufficient documentation

## 2015-03-12 DIAGNOSIS — R51 Headache: Secondary | ICD-10-CM | POA: Diagnosis not present

## 2015-03-12 DIAGNOSIS — R410 Disorientation, unspecified: Secondary | ICD-10-CM | POA: Insufficient documentation

## 2015-03-12 DIAGNOSIS — R21 Rash and other nonspecific skin eruption: Secondary | ICD-10-CM | POA: Diagnosis not present

## 2015-03-12 DIAGNOSIS — M542 Cervicalgia: Secondary | ICD-10-CM | POA: Diagnosis not present

## 2015-03-12 DIAGNOSIS — R110 Nausea: Secondary | ICD-10-CM | POA: Insufficient documentation

## 2015-03-12 LAB — URINALYSIS, ROUTINE W REFLEX MICROSCOPIC
Bilirubin Urine: NEGATIVE
Glucose, UA: NEGATIVE mg/dL
Ketones, ur: NEGATIVE mg/dL
NITRITE: NEGATIVE
PH: 7 (ref 5.0–8.0)
Protein, ur: NEGATIVE mg/dL
SPECIFIC GRAVITY, URINE: 1.019 (ref 1.005–1.030)
UROBILINOGEN UA: 0.2 mg/dL (ref 0.0–1.0)

## 2015-03-12 LAB — CBC WITH DIFFERENTIAL/PLATELET
BASOS ABS: 0 10*3/uL (ref 0.0–0.1)
Basophils Relative: 0 % (ref 0–1)
Eosinophils Absolute: 0.2 10*3/uL (ref 0.0–0.7)
Eosinophils Relative: 2 % (ref 0–5)
HCT: 41.4 % (ref 36.0–46.0)
Hemoglobin: 13.9 g/dL (ref 12.0–15.0)
LYMPHS PCT: 48 % — AB (ref 12–46)
Lymphs Abs: 3.7 10*3/uL (ref 0.7–4.0)
MCH: 29.1 pg (ref 26.0–34.0)
MCHC: 33.6 g/dL (ref 30.0–36.0)
MCV: 86.8 fL (ref 78.0–100.0)
Monocytes Absolute: 0.6 10*3/uL (ref 0.1–1.0)
Monocytes Relative: 7 % (ref 3–12)
NEUTROS ABS: 3.4 10*3/uL (ref 1.7–7.7)
Neutrophils Relative %: 43 % (ref 43–77)
PLATELETS: 270 10*3/uL (ref 150–400)
RBC: 4.77 MIL/uL (ref 3.87–5.11)
RDW: 12.8 % (ref 11.5–15.5)
WBC: 7.8 10*3/uL (ref 4.0–10.5)

## 2015-03-12 LAB — COMPREHENSIVE METABOLIC PANEL
ALK PHOS: 67 U/L (ref 38–126)
ALT: 14 U/L (ref 14–54)
ANION GAP: 7 (ref 5–15)
AST: 16 U/L (ref 15–41)
Albumin: 4.4 g/dL (ref 3.5–5.0)
BILIRUBIN TOTAL: 0.2 mg/dL — AB (ref 0.3–1.2)
BUN: 10 mg/dL (ref 6–20)
CHLORIDE: 106 mmol/L (ref 101–111)
CO2: 28 mmol/L (ref 22–32)
CREATININE: 0.79 mg/dL (ref 0.44–1.00)
Calcium: 9.5 mg/dL (ref 8.9–10.3)
GFR calc Af Amer: >60 mL/min (ref >60)
GLUCOSE: 111 mg/dL — AB (ref 65–99)
POTASSIUM: 3.7 mmol/L (ref 3.5–5.1)
Sodium: 141 mmol/L (ref 135–145)
Total Protein: 7 g/dL (ref 6.5–8.1)

## 2015-03-12 LAB — URINE MICROSCOPIC-ADD ON

## 2015-03-12 NOTE — ED Notes (Signed)
[facility]  , presents with multiple complaints : headache and diarrhea for 2 weeks : neck pain , and intermittent confusion . Pt. concerned about Listeria infection , denies fever or chills.

## 2015-03-13 ENCOUNTER — Emergency Department (HOSPITAL_COMMUNITY)
Admission: EM | Admit: 2015-03-13 | Discharge: 2015-03-13 | Disposition: A | Discharge status: Home / Self Care | Payer: BC Managed Care – PPO | Attending: Emergency Medicine | Admitting: Emergency Medicine

## 2015-03-13 DIAGNOSIS — R519 Headache, unspecified: Secondary | ICD-10-CM

## 2015-03-13 DIAGNOSIS — R51 Headache: Secondary | ICD-10-CM

## 2015-03-13 DIAGNOSIS — R21 Rash and other nonspecific skin eruption: Secondary | ICD-10-CM

## 2015-03-13 DIAGNOSIS — R197 Diarrhea, unspecified: Secondary | ICD-10-CM

## 2015-03-13 LAB — POC URINE PREG, ED: PREG TEST UR: NEGATIVE

## 2015-03-13 MED ORDER — SODIUM CHLORIDE 0.9 % IV BOLUS (SEPSIS)
2000.0000 mL | Freq: Once | INTRAVENOUS | Status: AC
  Administered 2015-03-13: 2000 mL via INTRAVENOUS

## 2015-03-13 NOTE — ED Provider Notes (Addendum)
CSN: [id number]     Arrival date & time 03/12/15  2143 History   None    This chart was scribed for Ashley Raceravid Mercy Leppla, MD by Arlan OrganAshley Horne, ED Scribe. This patient was seen in room D36C/D36C and the patient's care was started 1:35 AM.   Chief Complaint  Patient presents with  . Headache  . Neck Pain  . Diarrhea  . Altered Mental Status   The history is provided by the patient. No language interpreter was used.    HPI Comments: Ashley RammingPaige [facility]orne [facility] is a 39 y.o. female without any pertinent past medical history who presents to the Emergency Department here for multiple complaints this evening. Pt reports intermittent, ongoing, unchanged HA x 2 weeks. HA described as "it felt like my head is going to explode". She also reports diarrhea-loose stools, nausea, neck pain, intermittent confusion. Pt has also noted a pruritis rash to the R side of her neck onset today. Pt admits to playing at the park this afternoon with her daughter but denies being around any greenery. No OTC medications or home remedies attempted prior to arrival for symptoms. No recent fever, chills, or vomiting. No recent long distance travel. Pt has concern for a possible Listeria infection this evening. No known allergies to medications.  Past Medical History  Diagnosis Date  . No pertinent past medical history    Past Surgical History  Procedure Laterality Date  . Cesarean section    . Leep    . Cesarean section  07/02/2012    Procedure: CESAREAN SECTION;  Surgeon: Robley FriesVaishali R Mody, MD;  Location: [facility];  Service: Obstetrics;  Laterality: N/A;  EDD: 07/27/12/Repeat   No family history on file. History  Substance Use Topics  . Smoking status: Never Smoker   . Smokeless tobacco: Not on file  . Alcohol Use: No   OB History    Gravida Para Term Preterm AB TAB SAB Ectopic Multiple Living   3 2  2 1 1    2      Review of Systems  Constitutional: Negative for fever and chills.  Respiratory:  Negative for cough and shortness of breath.   Cardiovascular: Negative for chest pain.  Gastrointestinal: Positive for nausea and diarrhea. Negative for vomiting.  Musculoskeletal: Positive for neck pain.  Skin: Positive for rash.  Neurological: Positive for headaches.  Psychiatric/Behavioral: Positive for confusion.  All other systems reviewed and are negative.     Allergies  Review of patient's allergies indicates no known allergies.  Home Medications   Prior to Admission medications   Medication Sig Start Date End Date Taking? Authorizing Provider  valACYclovir (VALTREX) 500 MG tablet Take 500 mg by mouth daily as needed. Pt takes only during an outbreak   Yes Historical Provider, MD   Triage Vitals: BP 123/68 mmHg  Pulse 66  Temp(Src) 98.2 F (36.8 C) (Oral)  Resp 16  Ht 5\' 2"  (1.575 m)  Wt 153 lb 3 oz (69.485 kg)  BMI 28.01 kg/m2  SpO2 99%   Physical Exam  Constitutional: She is oriented to person, place, and time. She appears well-developed and well-nourished. No distress.  HENT:  Head: Normocephalic and atraumatic.  Mouth/Throat: Oropharynx is clear and moist. No oropharyngeal exudate.  Eyes: EOM are normal. Pupils are equal, round, and reactive to light.  Neck: Normal range of motion. Neck supple.  No meningismus  Cardiovascular: Normal rate and regular rhythm.  Exam reveals no gallop and no friction rub.  No murmur heard. Pulmonary/Chest: Effort normal and breath sounds normal. No respiratory distress. She has no wheezes. She has no rales. She exhibits no tenderness.  Abdominal: Soft. Bowel sounds are normal. She exhibits no distension and no mass. There is no tenderness. There is no rebound and no guarding.  Musculoskeletal: Normal range of motion. She exhibits no edema or tenderness.  Neurological: She is alert and oriented to person, place, and time.  Patient is alert and oriented x3 with clear, goal oriented speech. Patient has 5/5 motor in all extremities.  Sensation is intact to light touch. Bilateral finger-to-nose is normal with no signs of dysmetria. Patient has a normal gait and walks without assistance.  Skin: Skin is warm and dry. No rash noted. No erythema.  Psychiatric: She has a normal mood and affect. Her behavior is normal.  Nursing note and vitals reviewed.   ED Course  Procedures (including critical care time)  DIAGNOSTIC STUDIES: Oxygen Saturation is 98% on RA, Normal by my interpretation.    COORDINATION OF CARE: 1:40 AM- Will order CBC, CMP, urine pregnancy, and urinalysis, Discussed treatment plan with pt at bedside and pt agreed to plan.     Labs Review Labs Reviewed  CBC WITH DIFFERENTIAL/PLATELET - Abnormal; Notable for the following:    Lymphocytes Relative 48 (*)    All other components within normal limits  COMPREHENSIVE METABOLIC PANEL - Abnormal; Notable for the following:    Glucose, Bld 111 (*)    Total Bilirubin 0.2 (*)    All other components within normal limits  URINALYSIS, ROUTINE W REFLEX MICROSCOPIC (NOT AT [facility]) - Abnormal; Notable for the following:    APPearance CLOUDY (*)    Hgb urine dipstick TRACE (*)    Leukocytes, UA SMALL (*)    All other components within normal limits  URINE MICROSCOPIC-ADD ON - Abnormal; Notable for the following:    Squamous Epithelial / LPF FEW (*)    Bacteria, UA FEW (*)    All other components within normal limits  CULTURE, BLOOD (ROUTINE X 2)  CULTURE, BLOOD (ROUTINE X 2)  POC URINE PREG, ED    Imaging Review No results found.   EKG Interpretation None      MDM   Final diagnoses:  Nonintractable headache, unspecified chronicity pattern, unspecified headache type  Diarrhea  Rash   I personally performed the services described in this documentation, which was scribed in my presence. The recorded information has been reviewed and is accurate.  Patient is concerned about a possible listeria exposure. She states that she's been eating granola bars that  have been recalled. She presents with pressure-like headache and some muscle pain especially in the neck but without any stiffness. She's had several moments of confusion but no other mental status change. She's had watery loose stools for the past 2 weeks but these are improving. She states she is currently feeling much better. She has a normal nonfocal neurologic exam. She has normal vital signs other than mild hypertension. She has a normal white blood cell count though her lymphocytes percentage is elevated. Blood cultures have been sent. Discussed with patient the possibility of lumbar puncture. I have low suspicion for meningitis. Patient declined LP and agrees with plan to follow-up with her primary physician in 2 days. She's been given strict return precautions and is voiced understanding. We'll not start antibiotics at this time seeing that bacterial infection is very unlikely. Suspect possible viral infection.  Ashley Racer, MD 03/13/15 Cecilio Asper  Onalee Hua  Ranae Palms, MD 03/21/15 [phone]

## 2015-03-13 NOTE — Discharge Instructions (Signed)
Make sure you're drinking plenty of fluids. You may take Tylenol or ibuprofen for headache and muscle aches. Call and make an appointment to follow-up with your primary physician in one to 2 days. Return immediately for fever, increased confusion, neck stiffness, visual changes, persistent vomiting, worsening headache, focal weakness or for any concerns.   Diarrhea Diarrhea is frequent loose and watery bowel movements. It can cause you to feel weak and dehydrated. Dehydration can cause you to become tired and thirsty, have a dry mouth, and have decreased urination that often is dark yellow. Diarrhea is a sign of another problem, most often an infection that will not last long. In most cases, diarrhea typically lasts 2-3 days. However, it can last longer if it is a sign of something more serious. It is important to treat your diarrhea as directed by your caregiver to lessen or prevent future episodes of diarrhea. CAUSES  Some common causes include:  Gastrointestinal infections caused by viruses, bacteria, or parasites.  Food poisoning or food allergies.  Certain medicines, such as antibiotics, chemotherapy, and laxatives.  Artificial sweeteners and fructose.  Digestive disorders. HOME CARE INSTRUCTIONS  Ensure adequate fluid intake (hydration): Have 1 cup (8 oz) of fluid for each diarrhea episode. Avoid fluids that contain simple sugars or sports drinks, fruit juices, whole milk products, and sodas. Your urine should be clear or pale yellow if you are drinking enough fluids. Hydrate with an oral rehydration solution that you can purchase at pharmacies, retail stores, and online. You can prepare an oral rehydration solution at home by mixing the following ingredients together:   - tsp table salt.   tsp baking soda.   tsp salt substitute containing potassium chloride.  1  tablespoons sugar.  1 L (34 oz) of water.  Certain foods and beverages may increase the speed at which food moves  through the gastrointestinal (GI) tract. These foods and beverages should be avoided and include:  Caffeinated and alcoholic beverages.  High-fiber foods, such as raw fruits and vegetables, nuts, seeds, and whole grain breads and cereals.  Foods and beverages sweetened with sugar alcohols, such as xylitol, sorbitol, and mannitol.  Some foods may be well tolerated and may help thicken stool including:  Starchy foods, such as rice, toast, pasta, low-sugar cereal, oatmeal, grits, baked potatoes, crackers, and bagels.  Bananas.  Applesauce.  Add probiotic-rich foods to help increase healthy bacteria in the GI tract, such as yogurt and fermented milk products.  Wash your hands well after each diarrhea episode.  Only take over-the-counter or prescription medicines as directed by your caregiver.  Take a warm bath to relieve any burning or pain from frequent diarrhea episodes. SEEK IMMEDIATE MEDICAL CARE IF:   You are unable to keep fluids down.  You have persistent vomiting.  You have blood in your stool, or your stools are black and tarry.  You do not urinate in 6-8 hours, or there is only a small amount of very dark urine.  You have abdominal pain that increases or localizes.  You have weakness, dizziness, confusion, or light-headedness.  You have a severe headache.  Your diarrhea gets worse or does not get better.  You have a fever or persistent symptoms for more than 2-3 days.  You have a fever and your symptoms suddenly get worse. MAKE SURE YOU:   Understand these instructions.  Will watch your condition.  Will get help right away if you are not doing well or get worse. Document Released: 08/22/2002 Document Revised:  01/16/2014 Document Reviewed: 05/09/2012 ExitCare Patient Information 2015 [employer] [place]. This information is not intended to replace advice given to you by your health care provider. Make sure you discuss any questions you have with your health care  provider.  General Headache Without Cause A headache is pain or discomfort felt around the head or neck area. The specific cause of a headache may not be found. There are many causes and types of headaches. A few common ones are:  Tension headaches.  Migraine headaches.  Cluster headaches.  Chronic daily headaches. HOME CARE INSTRUCTIONS   Keep all follow-up appointments with your caregiver or any specialist referral.  Only take over-the-counter or prescription medicines for pain or discomfort as directed by your caregiver.  Lie down in a dark, quiet room when you have a headache.  Keep a headache journal to find out what may trigger your migraine headaches. For example, write down:  What you eat and drink.  How much sleep you get.  Any change to your diet or medicines.  Try massage or other relaxation techniques.  Put ice packs or heat on the head and neck. Use these 3 to 4 times per day for 15 to 20 minutes each time, or as needed.  Limit stress.  Sit up straight, and do not tense your muscles.  Quit smoking if you smoke.  Limit alcohol use.  Decrease the amount of caffeine you drink, or stop drinking caffeine.  Eat and sleep on a regular schedule.  Get 7 to 9 hours of sleep, or as recommended by your caregiver.  Keep lights dim if bright lights bother you and make your headaches worse. SEEK MEDICAL CARE IF:   You have problems with the medicines you were prescribed.  Your medicines are not working.  You have a change from the usual headache.  You have nausea or vomiting. SEEK IMMEDIATE MEDICAL CARE IF:   Your headache becomes severe.  You have a fever.  You have a stiff neck.  You have loss of vision.  You have muscular weakness or loss of muscle control.  You start losing your balance or have trouble walking.  You feel faint or pass out.  You have severe symptoms that are different from your first symptoms. MAKE SURE YOU:   Understand  these instructions.  Will watch your condition.  Will get help right away if you are not doing well or get worse. Document Released: 09/01/2005 Document Revised: 11/24/2011 Document Reviewed: 09/17/2011 [facility] Patient Information 2015 [place], [place]. This information is not intended to replace advice given to you by your health care provider. Make sure you discuss any questions you have with your health care provider.

## 2015-03-13 NOTE — ED Notes (Signed)
MD at bedside. 

## 2015-03-18 LAB — CULTURE, BLOOD (ROUTINE X 2)
CULTURE: NO GROWTH
CULTURE: NO GROWTH

## 2015-03-23 ENCOUNTER — Other Ambulatory Visit: Payer: Self-pay | Admitting: Family Medicine

## 2015-03-23 DIAGNOSIS — R51 Headache: Principal | ICD-10-CM

## 2015-03-23 DIAGNOSIS — R519 Headache, unspecified: Secondary | ICD-10-CM

## 2015-03-24 ENCOUNTER — Ambulatory Visit
Admission: RE | Admit: 2015-03-24 | Discharge: 2015-03-24 | Disposition: A | Discharge status: Home / Self Care | Payer: BC Managed Care – PPO | Source: Ambulatory Visit | Attending: Family Medicine | Admitting: Family Medicine

## 2015-03-24 DIAGNOSIS — R519 Headache, unspecified: Secondary | ICD-10-CM

## 2015-03-24 DIAGNOSIS — R51 Headache: Principal | ICD-10-CM

## 2015-03-27 ENCOUNTER — Other Ambulatory Visit: Payer: BC Managed Care – PPO

## 2015-03-28 ENCOUNTER — Ambulatory Visit (INDEPENDENT_AMBULATORY_CARE_PROVIDER_SITE_OTHER): Payer: BC Managed Care – PPO | Admitting: Neurology

## 2015-03-28 ENCOUNTER — Encounter: Payer: Self-pay | Admitting: Neurology

## 2015-03-28 VITALS — BP 122/92 | HR 64 | Ht 62.0 in | Wt 147.6 lb

## 2015-03-28 DIAGNOSIS — Z8249 Family history of ischemic heart disease and other diseases of the circulatory system: Secondary | ICD-10-CM

## 2015-03-28 DIAGNOSIS — G43109 Migraine with aura, not intractable, without status migrainosus: Secondary | ICD-10-CM | POA: Insufficient documentation

## 2015-03-28 DIAGNOSIS — G43111 Migraine with aura, intractable, with status migrainosus: Secondary | ICD-10-CM | POA: Diagnosis not present

## 2015-03-28 HISTORY — DX: Family history of ischemic heart disease and other diseases of the circulatory system: Z82.49

## 2015-03-28 HISTORY — DX: Migraine with aura, not intractable, without status migrainosus: G43.109

## 2015-03-28 MED ORDER — PREDNISONE 5 MG PO TABS: ORAL_TABLET | ORAL | Status: DC

## 2015-03-28 NOTE — Progress Notes (Signed)
Reason for visit: Migraine headache  Referring physician: Dr. Dianne Dunoss  Ashley Horne is a 39 y.o. female  History of present illness:  Ashley Horne is a 39 year old right-handed white female with a history of migraine headache since her teenage years. The patient previously was having several headaches a month, she could go 2 or 3 weeks between headaches, however. Her headaches would last up to 2 days. The headaches may be associated with some photophobia and some nausea and vomiting. Approximately 1 month ago, she began having daily headaches that were associated with more of a pressure sensation on the top of the head and in the front of the head. The patient was having some dizziness with the headache. On 03/13/2015, the patient had gone out to a restaurant, just as they were starting to leave, she was noted to have some loss of vision off to the left. She seems somewhat confused and disoriented. The patient was seen through an urgent care facility, and was sent to the emergency room for an evaluation. Eventually, the patient had MRI evaluation of the brain that was unremarkable. She has had ongoing headaches, but she denies any numbness or weakness of the face, arms, or legs. She has not had any speech disturbance or loss of consciousness. The patient goes on to say that there is a prominent family history of cerebral aneurysms. The patient has had 2 maternal aunts with cerebral aneurysms, one recently passed away following an aneurysmal rupture. The patient in the past has been on an antidepressive some sort that resulted in too many side effects, Imitrex in the past has helped her migraine, but resulted in too much drowsiness. The patient is sent to this office for further evaluation. The patient does not note any particular activating factors for her headache.  Past Medical History  Diagnosis Date  . Calculus of kidney   . Herpes   . Headache   . Classic migraine 03/28/2015  . Family history  of cerebral aneurysm 03/28/2015    Past Surgical History  Procedure Laterality Date  . Cesarean section    . Leep    . Cesarean section  07/02/2012    Procedure: CESAREAN SECTION;  Surgeon: Robley FriesVaishali R Mody, MD;  Location: [facility];  Service: Obstetrics;  Laterality: N/A;  EDD: 07/27/12/Repeat  . Anterior cruciate ligament repair      Family History  Problem Relation Age of Onset  . Aneurysm Maternal Aunt     cerebral  . Migraines Maternal Aunt   . Diabetes Paternal Uncle   . Diabetes Maternal Grandmother   . Melanoma Maternal Grandmother   . Prostate cancer Paternal Grandfather   . Migraines Mother   . Migraines Sister   . Aneurysm Maternal Aunt     Social history:  reports that she has never smoked. She has never used smokeless tobacco. She reports that she drinks about 2.4 - 3.6 oz of alcohol per week. She reports that she does not use illicit drugs.  Medications:  Prior to Admission medications   Medication Sig Start Date End Date Taking? Authorizing Provider  ALPRAZolam Prudy Feeler(XANAX) 0.5 MG tablet Take 0.5 mg by mouth 3 (three) times daily as needed. 03/22/15  Yes Historical Provider, MD  ibuprofen (ADVIL,MOTRIN) 200 MG tablet Take 200 mg by mouth every 6 (six) hours as needed.   Yes Historical Provider, MD  valACYclovir (VALTREX) 500 MG tablet Take 500 mg by mouth daily as needed. Pt takes only during an outbreak   Yes  Historical Provider, MD     No Known Allergies  ROS:  Out of a complete 14 system review of symptoms, the patient complains only of the following symptoms, and all other reviewed systems are negative.  Confusion, headache, weakness, dizziness Anxiety, not enough sleep, decreased energy Insomnia  Blood pressure 122/92, pulse 64, height  (1.575 m), weight 147 lb 9.6 oz (66.951 kg), unknown if currently breastfeeding.  Physical Exam  General: The patient is alert and cooperative at the time of the examination. The patient is minimally obese.  Eyes:  Pupils are equal, round, and reactive to light. Discs are flat bilaterally.  Neck: The neck is supple, no carotid bruits are noted.  Respiratory: The respiratory examination is clear.  Cardiovascular: The cardiovascular examination reveals a regular rate and rhythm, no obvious murmurs or rubs are noted.  Neuromuscular: Range of movement of the cervical spine is full. No crepitus is noted in the temporomandibular joints.  Skin: Extremities are without significant edema.  Neurologic Exam  Mental status: The patient is alert and oriented x 3 at the time of the examination. The patient has apparent normal recent and remote memory, with an apparently normal attention span and concentration ability.  Cranial nerves: Facial symmetry is present. There is good sensation of the face to pinprick and soft touch bilaterally. The strength of the facial muscles and the muscles to head turning and shoulder shrug are normal bilaterally. Speech is well enunciated, no aphasia or dysarthria is noted. Extraocular movements are full. Visual fields are full. The tongue is midline, and the patient has symmetric elevation of the soft palate. No obvious hearing deficits are noted.  Motor: The motor testing reveals 5 over 5 strength of all 4 extremities. Good symmetric motor tone is noted throughout.  Sensory: Sensory testing is intact to pinprick, soft touch, vibration sensation, and position sense on all 4 extremities. No evidence of extinction is noted.  Coordination: Cerebellar testing reveals good finger-nose-finger and heel-to-shin bilaterally.  Gait and station: Gait is normal. Tandem gait is normal. Romberg is negative. No drift is seen.  Reflexes: Deep tendon reflexes are symmetric and normal bilaterally. Toes are downgoing bilaterally.   MRI brain 03/25/15:  IMPRESSION: Normal MRI of the brain without contrast.  * MRI scan images were reviewed online. I agree with the written  report.    Assessment/Plan:  1. Classic migraine headache  2. Family history of cerebral aneurysm  The patient has had some left-sided visual loss and confusion associated with a migraine headache. The patient likely has classic migraine. The patient has had daily headaches over the last one month, likely has converted migraine. The patient will be given a trial on a prednisone Dosepak, 5 mg 6 day pack. If this is not effective, the patient will contact our office, and we will initiate daily prophylactic medication therapy for the headache. Given the family history of cerebral aneurysms, we will check MRA of the head. She will follow-up in 3 months. The patient has a strong family history of migraine, her mother and her sister have migraine.  Marlan Palau MD 03/28/2015 7:56 PM  [facility] [address] [place], [place] [address]  Phone [phone] Fax [phone]

## 2015-03-28 NOTE — Patient Instructions (Signed)

## 2015-04-02 ENCOUNTER — Telehealth: Payer: Self-pay | Admitting: Neurology

## 2015-04-02 MED ORDER — TOPIRAMATE 25 MG PO TABS: ORAL_TABLET | ORAL | Status: DC

## 2015-04-02 NOTE — Telephone Encounter (Signed)
I called the patient. She is having ongoing headaches, the prednisone did not help her. I will initiate treatment with Topamax. She will call me if she is having problems on this medication.

## 2015-04-02 NOTE — Telephone Encounter (Signed)
The patient called and stated that's she came in for an apt last week and Dr. Anne HahnWillis put her on Rx. PREDNISONE for her headaches. She said that that she has not had any relief and would like to know if there is something else she can take. Please call and advise.

## 2015-04-04 ENCOUNTER — Ambulatory Visit (INDEPENDENT_AMBULATORY_CARE_PROVIDER_SITE_OTHER): Payer: BC Managed Care – PPO

## 2015-04-04 DIAGNOSIS — G43111 Migraine with aura, intractable, with status migrainosus: Secondary | ICD-10-CM

## 2015-04-04 DIAGNOSIS — Z8249 Family history of ischemic heart disease and other diseases of the circulatory system: Secondary | ICD-10-CM | POA: Diagnosis not present

## 2015-04-05 ENCOUNTER — Telehealth: Payer: Self-pay | Admitting: Neurology

## 2015-04-05 NOTE — Telephone Encounter (Signed)
I called the patient. The MRA is normal. She is to continue the ramp up on the Topamax, and call me if the headaches are continuing.

## 2015-04-23 ENCOUNTER — Encounter: Payer: Self-pay | Admitting: Nurse Practitioner

## 2015-04-23 ENCOUNTER — Ambulatory Visit (INDEPENDENT_AMBULATORY_CARE_PROVIDER_SITE_OTHER): Payer: BC Managed Care – PPO | Admitting: Nurse Practitioner

## 2015-04-23 VITALS — BP 140/110 | HR 72 | Ht 62.0 in | Wt 149.2 lb

## 2015-04-23 DIAGNOSIS — R51 Headache: Secondary | ICD-10-CM | POA: Diagnosis not present

## 2015-04-23 DIAGNOSIS — R030 Elevated blood-pressure reading, without diagnosis of hypertension: Secondary | ICD-10-CM | POA: Diagnosis not present

## 2015-04-23 DIAGNOSIS — R232 Flushing: Secondary | ICD-10-CM | POA: Diagnosis not present

## 2015-04-23 DIAGNOSIS — R519 Headache, unspecified: Secondary | ICD-10-CM

## 2015-04-23 DIAGNOSIS — IMO0001 Reserved for inherently not codable concepts without codable children: Secondary | ICD-10-CM

## 2015-04-23 DIAGNOSIS — G8929 Other chronic pain: Secondary | ICD-10-CM

## 2015-04-23 LAB — BASIC METABOLIC PANEL
BUN: 15 mg/dL (ref 6–23)
CO2: 24 mEq/L (ref 19–32)
Calcium: 9.3 mg/dL (ref 8.4–10.5)
Chloride: 108 mEq/L (ref 96–112)
Creatinine, Ser: 0.82 mg/dL (ref 0.40–1.20)
GFR: 82.05 mL/min (ref >60.00)
Glucose, Bld: 84 mg/dL (ref 70–99)
Potassium: 3.7 mEq/L (ref 3.5–5.1)
Sodium: 140 mEq/L (ref 135–145)

## 2015-04-23 LAB — CBC
HCT: 39.9 % (ref 36.0–46.0)
Hemoglobin: 13.5 g/dL (ref 12.0–15.0)
MCHC: 33.9 g/dL (ref 30.0–36.0)
MCV: 85.7 fl (ref 78.0–100.0)
Platelets: 310 10*3/uL (ref 150.0–400.0)
RBC: 4.65 Mil/uL (ref 3.87–5.11)
RDW: 13.7 % (ref 11.5–15.5)
WBC: 5.9 10*3/uL (ref 4.0–10.5)

## 2015-04-23 LAB — SEDIMENTATION RATE: Sed Rate: 11 mm/hr (ref 0–22)

## 2015-04-23 MED ORDER — HYDROCHLOROTHIAZIDE 25 MG PO TABS: 25.0000 mg | ORAL_TABLET | Freq: Every day | ORAL | Status: DC

## 2015-04-23 NOTE — Patient Instructions (Addendum)
We will be checking the following labs today - BMET, sed rate, CBC   Medication Instructions:    Continue with your current medicines. I am  Adding HCTZ 25 mg to take one a day - this is at your drug store    Testing/Procedures To Be Arranged:  Echocardiogram  CTA of the abdomen - to look at the renals  24 hour urine  Follow-Up:   See me back for discussion - August 22nd    Other Special Instructions:   Monitor your blood pressure at home - keep a diary - bring with you on return OV  Call the [facility] office at [phone] if you have any questions, problems or concerns.

## 2015-04-23 NOTE — Addendum Note (Signed)
Addended by: Rosalio Macadamia on: 04/23/2015 03:49 PM   Modules accepted: Level of Service

## 2015-04-23 NOTE — Progress Notes (Signed)
CARDIOLOGY OFFICE NOTE  Date:  04/23/2015    Ashley Horne Date of Birth: 1976-07-12 Medical Record #[id number]  PCP:  Jarrett Soho, PA-C  Cardiologist:  Mayford Knife (NEW)    Chief Complaint  Patient presents with  . Altered Mental Status    New patient visit - seen for Dr. Mayford Knife    History of Present Illness: Ashley Horne is a 39 y.o. female who presents today for a new patient visit. Referred by GYN. She has no known past cardiac issues. She has had migraines.   Presented last week for her annual exam - endorsed an episode of confusion/dizziness/pounding heart rate - required an ER visit - this was back in June - she reported loose stools, nausea, neck pain and intermittent confusion and had a rash.  She was concerned about possible listeria exposure. This was felt to be viral in etiology.  Was sent to neurology by her PCP at [facility]. Negative MRI/MRA per Dr. Anne Hahn. Had daily headache unrelieved with steroid taper per neuro. She has had a recent family member have sudden death from aneurysm and this was why the MRI/MRA was completed and ok. Started on Topamax by neurology (Dr. Anne Hahn)  Referred here for further evaluation due to increase BP by her GYN. She had been to the beach last week and had such a horrible headache - actually took a family member's narcotic for relief. She does use some ibuprofen. No real excessive salt use. BP running 130/90 to 100 at home. She does note that she will get red in the face with no reason why. She has had itching of her legs when tries to exercise. Currently not on OC - has IUD in place.   Comes here today. Here alone. Little anxious. Very talkative. Still with dull headache. No chest pain. No shortness of breath. Occasional feeling of her heart beating hard. No history of HTN with mom or dad. No problems with her pregnancies in the past. Does not use too much salt. Does try to stay active.    Past Medical History  Diagnosis Date  . Calculus of  kidney   . Herpes   . Headache   . Classic migraine 03/28/2015  . Family history of cerebral aneurysm 03/28/2015  . Elevated blood pressure     Past Surgical History  Procedure Laterality Date  . Cesarean section    . Leep    . Cesarean section  07/02/2012    Procedure: CESAREAN SECTION;  Surgeon: Robley Fries, MD;  Location: [facility];  Service: Obstetrics;  Laterality: N/A;  EDD: 07/27/12/Repeat  . Anterior cruciate ligament repair       Medications: Current Outpatient Prescriptions  Medication Sig Dispense Refill  . ALPRAZolam (XANAX) 0.5 MG tablet Take 0.5 mg by mouth 3 (three) times daily as needed.  0  . ibuprofen (ADVIL,MOTRIN) 200 MG tablet Take 200 mg by mouth every 6 (six) hours as needed.    . topiramate (TOPAMAX) 25 MG tablet 1 tablet at night for one week, then take 2 tablets 60 tablet 3  . valACYclovir (VALTREX) 500 MG tablet Take 500 mg by mouth daily as needed. Pt takes only during an outbreak     No current facility-administered medications for this visit.    Allergies: No Known Allergies  Social History: The patient  reports that she has never smoked. She has never used smokeless tobacco. She reports that she drinks about 2.4 - 3.6 oz of alcohol per week. She reports  that she does not use illicit drugs.   Family History: The patient's family history includes Aneurysm in her maternal aunt and maternal aunt; Diabetes in her maternal grandmother and paternal uncle; High blood pressure in her paternal grandmother; Melanoma in her maternal grandmother; Migraines in her maternal aunt, mother, and sister; Prostate cancer in her paternal grandfather; Stroke in her maternal grandmother.   Review of Systems: Please see the history of present illness.   Otherwise, the review of systems is positive for none.   All other systems are reviewed and negative.   Physical Exam: VS:  BP 140/110 mmHg  Pulse 72  Ht 5\' 2"  (1.575 m)  Wt 149 lb 3.2 oz (67.677 kg)  BMI 27.28 kg/m2  .  BMI Body mass index is 27.28 kg/(m^2).  Wt Readings from Last 3 Encounters:  04/23/15 149 lb 3.2 oz (67.677 kg)  04/03/15 147 lb (66.679 kg)  03/28/15 147 lb 9.6 oz (66.951 kg)    General: Pleasant. Well developed, well nourished and in no acute distress.  HEENT: Normal. Neck: Supple, no JVD, carotid bruits, or masses noted.  Cardiac: Regular rate and rhythm. No murmurs, rubs, or gallops. No edema.  Respiratory:  Lungs are clear to auscultation bilaterally with normal work of breathing.  GI: Soft and nontender.  MS: No deformity or atrophy. Gait and ROM intact. Skin: Warm and dry. Color is normal.  Neuro:  Strength and sensation are intact and no gross focal deficits noted.  Psych: Alert, appropriate and with normal affect.   LABORATORY DATA:  EKG:  EKG is ordered today. This demonstrates NSR and is normal.  Lab Results  Component Value Date   WBC 7.8 03/12/2015   HGB 13.9 03/12/2015   HCT 41.4 03/12/2015   PLT 270 03/12/2015   GLUCOSE 111* 03/12/2015   ALT 14 03/12/2015   AST 16 03/12/2015   NA 141 03/12/2015   K 3.7 03/12/2015   CL 106 03/12/2015   CREATININE 0.79 03/12/2015   BUN 10 03/12/2015   CO2 28 03/12/2015    BNP (last 3 results) No results for input(s): BNP in the last 8760 hours.  ProBNP (last 3 results) No results for input(s): PROBNP in the last 8760 hours.   Other Studies Reviewed Today: MRI noted to be normal.   Assessment/Plan: 1. HTN/elevated BP - this has been persistent for the past several weeks. She notes a flushing/red face at time. She has had headache as well. I have discussed her case with Dr. Mayford Knife. BP is up here today at 140/110 by me. Starting HCTZ 25 mg a day. Checking lab to include BMET, CBC and sed rate. Arranging a CTA of the abdomen to evaluate the renals to rule out fibromuscular dysplasia. Arrange for echocardiogram to rule out structural heart disease. Will see back for discussion. She is to restrict salt. Keep BP diary.  Further disposition to follow.   2. Migraine/headache  Current medicines are reviewed with the patient today.  The patient does not have concerns regarding medicines other than what has been noted above.  The following changes have been made:  See above.  Labs/ tests ordered today include:   No orders of the defined types were placed in this encounter.     Disposition:  Further disposition to follow.   Patient is agreeable to this plan and will call if any problems develop in the interim.   Signed: Rosalio Macadamia, RN, ANP-C 04/23/2015 2:27 PM  [facility] [phone]  [facility] [place], [place]  [address] Phone: [phone] Fax: [phone]

## 2015-04-26 ENCOUNTER — Ambulatory Visit (HOSPITAL_COMMUNITY): Payer: BC Managed Care – PPO | Attending: Internal Medicine

## 2015-04-26 ENCOUNTER — Other Ambulatory Visit: Payer: BC Managed Care – PPO

## 2015-04-26 ENCOUNTER — Ambulatory Visit (INDEPENDENT_AMBULATORY_CARE_PROVIDER_SITE_OTHER)
Admission: RE | Admit: 2015-04-26 | Discharge: 2015-04-26 | Disposition: A | Discharge status: Home / Self Care | Payer: BC Managed Care – PPO | Source: Ambulatory Visit | Attending: Nurse Practitioner | Admitting: Nurse Practitioner

## 2015-04-26 ENCOUNTER — Other Ambulatory Visit: Payer: Self-pay

## 2015-04-26 DIAGNOSIS — R51 Headache: Secondary | ICD-10-CM

## 2015-04-26 DIAGNOSIS — G8929 Other chronic pain: Secondary | ICD-10-CM

## 2015-04-26 DIAGNOSIS — R030 Elevated blood-pressure reading, without diagnosis of hypertension: Secondary | ICD-10-CM

## 2015-04-26 DIAGNOSIS — R232 Flushing: Secondary | ICD-10-CM | POA: Diagnosis not present

## 2015-04-26 DIAGNOSIS — IMO0001 Reserved for inherently not codable concepts without codable children: Secondary | ICD-10-CM

## 2015-04-26 DIAGNOSIS — I5189 Other ill-defined heart diseases: Secondary | ICD-10-CM | POA: Insufficient documentation

## 2015-04-26 MED ORDER — IOHEXOL 350 MG/ML SOLN
100.0000 mL | Freq: Once | INTRAVENOUS | Status: AC | PRN
  Administered 2015-04-26: 100 mL via INTRAVENOUS

## 2015-04-27 ENCOUNTER — Other Ambulatory Visit: Payer: Self-pay | Admitting: *Deleted

## 2015-04-27 DIAGNOSIS — Q785 Metaphyseal dysplasia: Secondary | ICD-10-CM

## 2015-04-27 DIAGNOSIS — Q789 Osteochondrodysplasia, unspecified: Secondary | ICD-10-CM

## 2015-05-02 ENCOUNTER — Telehealth: Payer: Self-pay | Admitting: *Deleted

## 2015-05-02 ENCOUNTER — Telehealth: Payer: Self-pay | Admitting: Nurse Practitioner

## 2015-05-02 NOTE — Telephone Encounter (Signed)
Patient requested a copy of Mra Head Disc and report at front desk  05/02/15.

## 2015-05-02 NOTE — Telephone Encounter (Signed)
New message      Pt c/o medication issue:  1. Name of Medication: topamax  2. How are you currently taking this medication (dosage and times per day)? 2 tablets at night 3. Are you having a reaction (difficulty breathing--STAT)? no  4. What is your medication issue? Pt took blood pressure medication last night instead of topamax----she took bp medication twice and did not take topamax at all last night.  Now she has not had topamax since Monday night. So, should she take topamax today?  She has an appt on Monday.  She will run out of topamax saturday.  If Lawson Fiscal is going to take her off of rx, she do not want to get a refill.  Please call

## 2015-05-02 NOTE — Telephone Encounter (Signed)
I was not the one that prescribed the Topamax.   She should discuss that with that prescribing person.

## 2015-05-03 ENCOUNTER — Telehealth: Payer: Self-pay | Admitting: Neurology

## 2015-05-03 MED ORDER — TOPIRAMATE 100 MG PO TABS: 100.0000 mg | ORAL_TABLET | Freq: Every day | ORAL | Status: DC

## 2015-05-03 NOTE — Telephone Encounter (Signed)
I called the patient. The patient is still having headaches on 50 mg at night of the Topamax, we will go up to 75 mg per week, then go to 100 mg at night. She will contact me if she is still not doing well.

## 2015-05-03 NOTE — Telephone Encounter (Signed)
Spoke with pt and informed her that Lawson Fiscal said pt should discuss Topamax with prescribing physician. Pt in agreement with this plan. Pt states that she did some research on FMD and was wondering if she is being referred to [facility] because she is in kidney failure. Advised pt that she is being referred to [facility] for consideration of an arteriogram. Reminded pt that her blood work was normal from 8/9. Pt verbalized understanding and was appreciative for call back.

## 2015-05-03 NOTE — Telephone Encounter (Signed)
Follow up      Pt states no one called her back regarding taking topamax and pt has a question about her CT scan results.  She has an appt on Monday to see Lawson Fiscal, but is anxious and cannot wait until her appt.  Please call today

## 2015-05-03 NOTE — Telephone Encounter (Signed)
Patient is calling to let Dr. Anne Hahn know that she was recently diagnosed with Fibromuscular dysplasia. She has been taking topiramate (TOPAMAX) 25 MG tablet and it is not helping with her headaches. Please call to discuss.

## 2015-05-05 LAB — CATECHOLAMINES, FRACTIONATED, URINE, 24 HOUR
Calculated Total (E+NE): 50 mcg/24 h (ref 26–121)
Creatinine, Urine mg/day-CATEUR: 1.02 g/(24.h) (ref 0.63–2.50)
Dopamine, 24 hr Urine: 134 mcg/24 h (ref 52–480)
Epinephrine, 24 hr Urine: 5 mcg/24 h (ref 2–24)
Norepinephrine, 24 hr Ur: 45 mcg/24 h (ref 15–100)
Total Volume - CF 24Hr U: 675 mL

## 2015-05-07 ENCOUNTER — Ambulatory Visit (INDEPENDENT_AMBULATORY_CARE_PROVIDER_SITE_OTHER): Payer: BC Managed Care – PPO | Admitting: Nurse Practitioner

## 2015-05-07 ENCOUNTER — Telehealth: Payer: Self-pay | Admitting: *Deleted

## 2015-05-07 ENCOUNTER — Other Ambulatory Visit: Payer: Self-pay | Admitting: *Deleted

## 2015-05-07 ENCOUNTER — Encounter: Payer: Self-pay | Admitting: Nurse Practitioner

## 2015-05-07 VITALS — BP 110/90 | HR 102 | Ht 62.0 in | Wt 140.0 lb

## 2015-05-07 DIAGNOSIS — E876 Hypokalemia: Secondary | ICD-10-CM

## 2015-05-07 DIAGNOSIS — I10 Essential (primary) hypertension: Secondary | ICD-10-CM

## 2015-05-07 LAB — BASIC METABOLIC PANEL
BUN: 19 mg/dL (ref 6–23)
CO2: 28 mEq/L (ref 19–32)
Calcium: 10.2 mg/dL (ref 8.4–10.5)
Chloride: 97 mEq/L (ref 96–112)
Creatinine, Ser: 0.92 mg/dL (ref 0.40–1.20)
GFR: 71.83 mL/min (ref >60.00)
Glucose, Bld: 91 mg/dL (ref 70–99)
Potassium: 2.5 mEq/L — CL (ref 3.5–5.1)
Sodium: 136 mEq/L (ref 135–145)

## 2015-05-07 MED ORDER — HYDROCHLOROTHIAZIDE 25 MG PO TABS: 12.5000 mg | ORAL_TABLET | Freq: Every day | ORAL | Status: DC

## 2015-05-07 MED ORDER — POTASSIUM CHLORIDE CRYS ER 20 MEQ PO TBCR: 20.0000 meq | EXTENDED_RELEASE_TABLET | Freq: Every day | ORAL | Status: DC

## 2015-05-07 NOTE — Telephone Encounter (Signed)
Ashley Horne from [facility] in [employer] called for clarification on the rx for potassium that was sent in. Please call back at [phone]. Thanks, MI

## 2015-05-07 NOTE — Progress Notes (Signed)
CARDIOLOGY OFFICE NOTE  Date:  05/07/2015    Ashley Horne Date of Birth: 1976/04/06 Medical Record #[id number]  PCP:  Jarrett Soho, PA-C  Cardiologist:  Mayford Knife    Chief Complaint  Patient presents with  . Hypertension    Follow up visit - seen for Dr. Mayford Knife    History of Present Illness: Ashley Horne is a 39 y.o. female who presents today for a follow up visit. Referred by GYN. She has no known past cardiac issues. She has had migraines.   Presented earlier this month for her annual exam - endorsed an episode of confusion/dizziness/pounding heart rate - required an ER visit - this was back in June - she reported loose stools, nausea, neck pain and intermittent confusion and had a rash. She was concerned about possible listeria exposure. This was felt to be viral in etiology. Was sent to neurology by her PCP at [facility]. Negative MRI/MRA per Dr. Anne Hahn. Had daily headache unrelieved with steroid taper per neuro. She has had a recent family member have sudden death from aneurysm and this was why the MRI/MRA was completed and ok. Started on Topamax by neurology (Dr. Anne Hahn) Referred here for further evaluation due to increase BP by her GYN. Referred here for elevated BP. I started her on HCTZ and got a CTA of the kidneys - this was suggestive of FMD.   Comes here today. Here with her mom today. She has been quite anxious. Admits she got on goggle and looked up FMD - then just assumed it would all be "down hill" from there with kidney failure. No chest pain. Breathing is ok. She has really cut out the salt. BP has improved. Her diary shows great control at home. She has lost weight. Her dose of Topamax is being increased. Her 24 hour urine is pending.    Past Medical History  Diagnosis Date  . Calculus of kidney   . Herpes   . Headache   . Classic migraine 03/28/2015  . Family history of cerebral aneurysm 03/28/2015  . Elevated blood pressure     Past Surgical History    Procedure Laterality Date  . Cesarean section    . Leep    . Cesarean section  07/02/2012    Procedure: CESAREAN SECTION;  Surgeon: Robley Fries, MD;  Location: [facility];  Service: Obstetrics;  Laterality: N/A;  EDD: 07/27/12/Repeat  . Anterior cruciate ligament repair       Medications: Current Outpatient Prescriptions  Medication Sig Dispense Refill  . ALPRAZolam (XANAX) 0.5 MG tablet Take 0.5 mg by mouth 3 (three) times daily as needed.  0  . hydrochlorothiazide (HYDRODIURIL) 25 MG tablet Take 1 tablet (25 mg total) by mouth daily. 30 tablet 3  . ibuprofen (ADVIL,MOTRIN) 200 MG tablet Take 200 mg by mouth every 6 (six) hours as needed.    . topiramate (TOPAMAX) 100 MG tablet Take 1 tablet (100 mg total) by mouth at bedtime. 30 tablet 3  . valACYclovir (VALTREX) 500 MG tablet Take 500 mg by mouth daily as needed. Pt takes only during an outbreak     No current facility-administered medications for this visit.    Allergies: No Known Allergies  Social History: The patient  reports that she has never smoked. She has never used smokeless tobacco. She reports that she drinks about 2.4 - 3.6 oz of alcohol per week. She reports that she does not use illicit drugs.   Family History: The patient's family history  includes Aneurysm in her maternal aunt and maternal aunt; Diabetes in her maternal grandmother and paternal uncle; High blood pressure in her paternal grandmother; Melanoma in her maternal grandmother; Migraines in her maternal aunt, mother, and sister; Prostate cancer in her paternal grandfather; Stroke in her maternal grandmother.   Review of Systems: Please see the history of present illness.   Otherwise, the review of systems is positive for none.   All other systems are reviewed and negative.   Physical Exam: VS:  BP 110/90 mmHg  Pulse 102  Ht 5\' 2"  (1.575 m)  Wt 140 lb (63.504 kg)  BMI 25.60 kg/m2  SpO2 97% .  BMI Body mass index is 25.6 kg/(m^2).  Wt Readings from  Last 3 Encounters:  05/07/15 140 lb (63.504 kg)  04/23/15 149 lb 3.2 oz (67.677 kg)  04/03/15 147 lb (66.679 kg)    General: Pleasant. Well developed, well nourished and in no acute distress.  HEENT: Normal. Neck: Supple, no JVD, carotid bruits, or masses noted.  Cardiac: Regular rate and rhythm. No murmurs, rubs, or gallops. No edema.  Respiratory:  Lungs are clear to auscultation bilaterally with normal work of breathing.  GI: Soft and nontender.  MS: No deformity or atrophy. Gait and ROM intact. Skin: Warm and dry. Color is normal.  Neuro:  Strength and sensation are intact and no gross focal deficits noted.  Psych: Alert, appropriate and with normal affect.   LABORATORY DATA:  EKG:  EKG is not ordered today.   Lab Results  Component Value Date   WBC 5.9 04/23/2015   HGB 13.5 04/23/2015   HCT 39.9 04/23/2015   PLT 310.0 04/23/2015   GLUCOSE 84 04/23/2015   ALT 14 03/12/2015   AST 16 03/12/2015   NA 140 04/23/2015   K 3.7 04/23/2015   CL 108 04/23/2015   CREATININE 0.82 04/23/2015   BUN 15 04/23/2015   CO2 24 04/23/2015    BNP (last 3 results) No results for input(s): BNP in the last 8760 hours.  ProBNP (last 3 results) No results for input(s): PROBNP in the last 8760 hours.   Other Studies Reviewed Today: CTA ABDOMEN IMPRESSION: Findings above suggest early fibromuscular dysplasia in the right renal artery. Angiography with pressure measurements can be performed to further delineate with intended endovascular therapy.   Electronically Signed  By: Jolaine Click M.D.  On: 04/26/2015 15:01   ECHO Study Conclusions  - Left ventricle: The cavity size was normal. Wall thickness was normal. Systolic function was normal. The estimated ejection fraction was in the range of 55% to 60%. Wall motion was normal; there were no regional wall motion abnormalities. Doppler parameters are consistent with abnormal left ventricular relaxation (grade 1  diastolic dysfunction). The E/e&' ratio is <8, suggesting normal LV filling pressure. - Left atrium: The atrium was normal in size.  Impressions:  - LVEF 55-60%, normal wall thickness, normal wall motion, diastolic dysfunction, normal LV filling pressure, normal LA size.   Assessment/Plan: 1. HTN/elevated BP - her BP has improved nicely. Echo basically normal. CTA with early FMD. Referred to [facility] - seeing them next week. For now, I have left her on her current regimen. She will continue with sodium restriction, diet, etc. Await her 24 hour urine results. Further disposition to follow after seen by [facility] - possible arteriogram.  2. Migraine/headache - on Topamax now with escalating doses.   Current medicines are reviewed with the patient today.  The patient does not have concerns regarding medicines other  than what has been noted above.  The following changes have been made:  See above.  Labs/ tests ordered today include:    Orders Placed This Encounter  Procedures  . Basic metabolic panel     Disposition:   Further disposition to follow after seen by Dr. Edilia Bo.   Patient is agreeable to this plan and will call if any problems develop in the interim.   Signed: Rosalio Macadamia, RN, ANP-C 05/07/2015 2:02 PM  [facility] [address] [place], [place]  [address] Phone: [phone] Fax: [phone]

## 2015-05-07 NOTE — Patient Instructions (Addendum)
We will be checking the following labs today - BMET   Medication Instructions:    Continue with your current medicines.     Testing/Procedures To Be Arranged:  N/A  Follow-Up:   We will decide about your follow up after you see Dr. Edilia Bo.    Other Special Instructions:   Continue to monitor your blood pressure at home  Call the [facility] office at [phone] if you have any questions, problems or concerns.

## 2015-05-08 LAB — METANEPHRINES, URINE, 24 HOUR
Metaneph Total, Ur: 273 mcg/24 h (ref 182–739)
Metanephrines, Ur: 39 mcg/24 h — ABNORMAL LOW (ref 58–203)
Normetanephrine, 24H Ur: 234 mcg/24 h (ref 88–649)

## 2015-05-09 ENCOUNTER — Other Ambulatory Visit (INDEPENDENT_AMBULATORY_CARE_PROVIDER_SITE_OTHER): Payer: BC Managed Care – PPO | Admitting: *Deleted

## 2015-05-09 ENCOUNTER — Other Ambulatory Visit: Payer: Self-pay | Admitting: *Deleted

## 2015-05-09 DIAGNOSIS — E876 Hypokalemia: Secondary | ICD-10-CM

## 2015-05-09 LAB — BASIC METABOLIC PANEL
BUN: 18 mg/dL (ref 6–23)
CO2: 23 mEq/L (ref 19–32)
Calcium: 9.8 mg/dL (ref 8.4–10.5)
Chloride: 101 mEq/L (ref 96–112)
Creatinine, Ser: 0.89 mg/dL (ref 0.40–1.20)
GFR: 74.63 mL/min (ref >60.00)
Glucose, Bld: 97 mg/dL (ref 70–99)
Potassium: 3 mEq/L — ABNORMAL LOW (ref 3.5–5.1)
Sodium: 136 mEq/L (ref 135–145)

## 2015-05-11 ENCOUNTER — Ambulatory Visit: Payer: BC Managed Care – PPO | Admitting: Cardiovascular Disease

## 2015-05-11 NOTE — Telephone Encounter (Signed)
Can I close this encounter?

## 2015-05-14 ENCOUNTER — Other Ambulatory Visit (INDEPENDENT_AMBULATORY_CARE_PROVIDER_SITE_OTHER): Payer: BC Managed Care – PPO | Admitting: *Deleted

## 2015-05-14 DIAGNOSIS — E876 Hypokalemia: Secondary | ICD-10-CM | POA: Diagnosis not present

## 2015-05-14 LAB — BASIC METABOLIC PANEL
BUN: 14 mg/dL (ref 6–23)
CO2: 25 mEq/L (ref 19–32)
Calcium: 9 mg/dL (ref 8.4–10.5)
Chloride: 106 mEq/L (ref 96–112)
Creatinine, Ser: 0.97 mg/dL (ref 0.40–1.20)
GFR: 67.57 mL/min (ref >60.00)
Glucose, Bld: 87 mg/dL (ref 70–99)
Potassium: 3.2 mEq/L — ABNORMAL LOW (ref 3.5–5.1)
Sodium: 139 mEq/L (ref 135–145)

## 2015-05-15 ENCOUNTER — Encounter: Payer: Self-pay | Admitting: Vascular Surgery

## 2015-05-15 ENCOUNTER — Other Ambulatory Visit: Payer: Self-pay | Admitting: *Deleted

## 2015-05-15 DIAGNOSIS — E876 Hypokalemia: Secondary | ICD-10-CM

## 2015-05-15 MED ORDER — POTASSIUM CHLORIDE CRYS ER 20 MEQ PO TBCR: 40.0000 meq | EXTENDED_RELEASE_TABLET | Freq: Every day | ORAL | Status: DC

## 2015-05-16 ENCOUNTER — Ambulatory Visit (INDEPENDENT_AMBULATORY_CARE_PROVIDER_SITE_OTHER): Payer: BC Managed Care – PPO | Admitting: Vascular Surgery

## 2015-05-16 ENCOUNTER — Encounter: Payer: Self-pay | Admitting: Vascular Surgery

## 2015-05-16 VITALS — BP 119/83 | HR 85 | Temp 98.5°F | Resp 16 | Ht 62.0 in | Wt 139.0 lb

## 2015-05-16 DIAGNOSIS — R208 Other disturbances of skin sensation: Secondary | ICD-10-CM

## 2015-05-16 DIAGNOSIS — R200 Anesthesia of skin: Secondary | ICD-10-CM | POA: Insufficient documentation

## 2015-05-16 DIAGNOSIS — I10 Essential (primary) hypertension: Secondary | ICD-10-CM

## 2015-05-16 NOTE — Progress Notes (Signed)
[facility]  Patient name: Ashley Horne MRN: [id number] DOB: 26-Mar-1976 Sex: female  REASON FOR CONSULT: fibromuscular dysplasia of right renal artery  HPI: Ashley Horne is a 39 y.o. female who underwent a CT angiogram as part of a workup for hypertension and headaches. The CT angiogram suggested possible early fibromuscular dysplasia of the right renal artery and she was sent for vascular consultation. She states that her blood pressure has not been an issue until approximately June when she began noticing elevated blood pressures. She was also having some headaches. This prompted the CT scan described above.  She has been evaluated by neurology for her headaches and was started on Topamax, which has helped her headaches. She is also undergone an MRA of the head and MRI in July both of which were unremarkable. Of note, the internal carotid arteries on the MRA showed no significant stenosis. She was evaluated by cardiology and started on hydrochlorothiazide 25 mg for her blood pressure. Her blood pressure has been under better control since then. She states that the highest her pressure was was 140/110.  She denies any history of diabetes, hypercholesterolemia, family history of premature cardiovascular disease, or smoking history.  Her only other complaint has been some paresthesias in her feet. She denies claudication, rest pain, or nonhealing ulcers. Her CT scan did show some degenerative disc disease at T11-T12.   Past Medical History  Diagnosis Date  . Calculus of kidney   . Herpes   . Headache   . Classic migraine 03/28/2015  . Family history of cerebral aneurysm 03/28/2015  . Elevated blood pressure    Family History  Problem Relation Age of Onset  . Aneurysm Maternal Aunt     cerebral  . Migraines Maternal Aunt   . Diabetes Paternal Uncle   . Diabetes Maternal Grandmother   . Melanoma Maternal Grandmother   . Prostate cancer Paternal  Grandfather   . Migraines Mother   . Migraines Sister   . Aneurysm Maternal Aunt   . Stroke Maternal Grandmother   . High blood pressure Paternal Grandmother    SOCIAL HISTORY: Social History  Substance Use Topics  . Smoking status: Never Smoker   . Smokeless tobacco: Never Used  . Alcohol Use: 2.4 - 3.6 oz/week    4-6 Glasses of wine per week   No Known Allergies Current Outpatient Prescriptions  Medication Sig Dispense Refill  . ALPRAZolam (XANAX) 0.5 MG tablet Take 0.5 mg by mouth 3 (three) times daily as needed.  0  . hydrochlorothiazide (HYDRODIURIL) 25 MG tablet Take 0.5 tablets (12.5 mg total) by mouth daily. 30 tablet 3  . ibuprofen (ADVIL,MOTRIN) 200 MG tablet Take 200 mg by mouth every 6 (six) hours as needed.    . potassium chloride SA (K-DUR,KLOR-CON) 20 MEQ tablet Take 2 tablets (40 mEq total) by mouth daily. 60 tablet 6  . topiramate (TOPAMAX) 100 MG tablet Take 1 tablet (100 mg total) by mouth at bedtime. 30 tablet 3  . valACYclovir (VALTREX) 500 MG tablet Take 500 mg by mouth daily as needed. Pt takes only during an outbreak     No current facility-administered medications for this visit.   REVIEW OF SYSTEMS: Arly.Keller ] denotes positive finding; [  ] denotes negative finding  CARDIOVASCULAR:  [ ]  chest pain   [ ]  chest pressure   [ ]  palpitations   [ ]  orthopnea   [ ]  dyspnea on exertion   [ ]  claudication   [ ]   rest pain    DVT    phlebitis PULMONARY:    productive cough    asthma    wheezing NEUROLOGIC:    weakness  Arly.Keller ] paresthesias - toes of both feet.   aphasia   amaurosis  Arly.Keller ] dizziness HEMATOLOGIC:    bleeding problems    clotting disorders MUSCULOSKELETAL:   joint pain    joint swelling  leg swelling GASTROINTESTINAL:   blood in stool    hematemesis GENITOURINARY:    dysuria    hematuria PSYCHIATRIC:   history of major depression INTEGUMENTARY:   rashes   ulcers CONSTITUTIONAL:   fever     chills  PHYSICAL EXAM: Filed Vitals:   05/16/15 1314  BP: 119/83  Pulse: 85  Temp: 98.5 F (36.9 C)  TempSrc: Oral  Resp: 16  Height:  (1.575 m)  Weight: 139 lb (63.05 kg)  SpO2: 99%   GENERAL: The patient is a well-nourished female, in no acute distress. The vital signs are documented above. CARDIAC: There is a regular rate and rhythm.  VASCULAR: I do not detect carotid bruits. She has palpable dorsalis pedis pulses bilaterally. She has no significant lower extremity swelling. PULMONARY: There is good air exchange bilaterally without wheezing or rales. ABDOMEN: Soft and non-tender with normal pitched bowel sounds. I do not detect an abdominal bruit. MUSCULOSKELETAL: There are no major deformities or cyanosis. NEUROLOGIC: No focal weakness or paresthesias are detected. SKIN: There are no ulcers or rashes noted. PSYCHIATRIC: The patient has a normal affect.  DATA:  CT ANGIOGRAM: I have reviewed the images from the CT angiogram done on 04/26/2015. I personally do not see any evidence of fibromuscular dysplasia or stenosis of either renal artery.   MEDICAL ISSUES:  HYPERTENSION: Her blood pressure is well controlled on a small dose of hydrochlorothiazide. I do not see any evidence of fibromuscular dysplasia on her CT angiogram and the images are quite good. She has had several physicians at [facility] (through family members) review the films and they likewise do not see any significant fibromuscular dysplasia. At this point I would not recommend arteriography as even if she had mild fibromuscular dysplasia I would be reluctant to consider angioplasty for such mild disease. If her blood pressure becomes more of a problem in the future then certainly we could consider this, however at this point based on her CT angiogram I do not think she has any significant stenosis that would warrant intervention.  PARESTHESIAS BOTH FEET: I reassured her that she has excellent arterial  perfusion and I do not think that her symptoms are related to peripheral vascular disease. This could potentially be related to her degenerative disc disease of her back.   Waverly Ferrari [facility] [place] Beeper: [phone]

## 2015-05-23 ENCOUNTER — Other Ambulatory Visit: Payer: Self-pay | Admitting: *Deleted

## 2015-05-23 ENCOUNTER — Other Ambulatory Visit (INDEPENDENT_AMBULATORY_CARE_PROVIDER_SITE_OTHER): Payer: Self-pay | Admitting: *Deleted

## 2015-05-23 DIAGNOSIS — E876 Hypokalemia: Secondary | ICD-10-CM

## 2015-05-23 LAB — BASIC METABOLIC PANEL
BUN: 14 mg/dL (ref 6–23)
CO2: 22 mEq/L (ref 19–32)
Calcium: 9 mg/dL (ref 8.4–10.5)
Chloride: 108 mEq/L (ref 96–112)
Creatinine, Ser: 0.83 mg/dL (ref 0.40–1.20)
GFR: 80.88 mL/min (ref >60.00)
Glucose, Bld: 79 mg/dL (ref 70–99)
Potassium: 3.8 mEq/L (ref 3.5–5.1)
Sodium: 140 mEq/L (ref 135–145)

## 2015-06-22 ENCOUNTER — Other Ambulatory Visit (INDEPENDENT_AMBULATORY_CARE_PROVIDER_SITE_OTHER): Payer: 59 | Admitting: *Deleted

## 2015-06-22 DIAGNOSIS — E876 Hypokalemia: Secondary | ICD-10-CM | POA: Diagnosis not present

## 2015-06-22 LAB — BASIC METABOLIC PANEL
BUN: 14 mg/dL (ref 7–25)
CHLORIDE: 110 mmol/L (ref 98–110)
CO2: 21 mmol/L (ref 20–31)
Calcium: 8.9 mg/dL (ref 8.6–10.2)
Creat: 0.81 mg/dL (ref 0.50–1.10)
Glucose, Bld: 90 mg/dL (ref 65–99)
Potassium: 3.7 mmol/L (ref 3.5–5.3)
Sodium: 140 mmol/L (ref 135–146)

## 2015-06-28 ENCOUNTER — Ambulatory Visit: Payer: BC Managed Care – PPO | Admitting: Adult Health

## 2015-07-02 ENCOUNTER — Ambulatory Visit: Payer: BC Managed Care – PPO | Admitting: Adult Health

## 2015-07-05 ENCOUNTER — Ambulatory Visit: Payer: BC Managed Care – PPO | Admitting: Adult Health

## 2015-07-09 ENCOUNTER — Encounter: Payer: Self-pay | Admitting: Adult Health

## 2015-07-09 ENCOUNTER — Ambulatory Visit (INDEPENDENT_AMBULATORY_CARE_PROVIDER_SITE_OTHER): Payer: 59 | Admitting: Adult Health

## 2015-07-09 VITALS — BP 102/65 | HR 58 | Ht 62.0 in | Wt 141.0 lb

## 2015-07-09 DIAGNOSIS — G43009 Migraine without aura, not intractable, without status migrainosus: Secondary | ICD-10-CM

## 2015-07-09 NOTE — Progress Notes (Signed)
PATIENT: Ashley Horne DOB: 1975/10/04  REASON FOR VISIT: follow up- migraine headache HISTORY FROM: patient  HISTORY OF PRESENT ILLNESS: Ashley Horne is a 39 year old female with a history of migraine headaches. She returns today for follow-up. She was started on Topamax 100 mg at bedtime. She reports that her headaches have improved significantly since starting Topamax. She reports that she has had approximately one headache since increasing her medication to 100 mg. She states that she may have other headaches such as a tension or stress related headache. She states that she normally can take Advil and her headache improves within 30-45 minutes. She does have a headache today. She states that she woke up with a headache however she has not taken any medication. She states as the day has progressed her headache has gotten slightly worse. She states that her headache is located above the eyes in the frontal region. Denies photophobia or phonophobia. She has had some mild nausea. She rates her pain as a 4 out of 10. She reports that she normally takes Advil and as a last resort she may have a BC powder. She reports that she is not had a BC powder since July. She returns today for an evaluation.  HISTORY 03/28/15 (WILLIS): Ashley Horne is a 39 year old right-handed white female with a history of migraine headache since her teenage years. The patient previously was having several headaches a month, she could go 2 or 3 weeks between headaches, however. Her headaches would last up to 2 days. The headaches may be associated with some photophobia and some nausea and vomiting. Approximately 1 month ago, she began having daily headaches that were associated with more of a pressure sensation on the top of the head and in the front of the head. The patient was having some dizziness with the headache. On 03/13/2015, the patient had gone out to a restaurant, just as they were starting to leave, she was noted to have  some loss of vision off to the left. She seems somewhat confused and disoriented. The patient was seen through an urgent care facility, and was sent to the emergency room for an evaluation. Eventually, the patient had MRI evaluation of the brain that was unremarkable. She has had ongoing headaches, but she denies any numbness or weakness of the face, arms, or legs. She has not had any speech disturbance or loss of consciousness. The patient goes on to say that there is a prominent family history of cerebral aneurysms. The patient has had 2 maternal aunts with cerebral aneurysms, one recently passed away following an aneurysmal rupture. The patient in the past has been on an antidepressive some sort that resulted in too many side effects, Imitrex in the past has helped her migraine, but resulted in too much drowsiness. The patient is sent to this office for further evaluation. The patient does not note any particular activating factors for her headache.  REVIEW OF SYSTEMS: Out of a complete 14 system review of symptoms, the patient complains only of the following symptoms, and all other reviewed systems are negative.  See history of present illness  ALLERGIES: No Known Allergies  HOME MEDICATIONS: Outpatient Prescriptions Prior to Visit  Medication Sig Dispense Refill  . ALPRAZolam (XANAX) 0.5 MG tablet Take 0.5 mg by mouth 3 (three) times daily as needed.  0  . hydrochlorothiazide (HYDRODIURIL) 25 MG tablet Take 0.5 tablets (12.5 mg total) by mouth daily. 30 tablet 3  . ibuprofen (ADVIL,MOTRIN) 200 MG tablet Take 200  mg by mouth every 6 (six) hours as needed.    . potassium chloride SA (K-DUR,KLOR-CON) 20 MEQ tablet Take 2 tablets (40 mEq total) by mouth daily. 60 tablet 6  . topiramate (TOPAMAX) 100 MG tablet Take 1 tablet (100 mg total) by mouth at bedtime. 30 tablet 3  . valACYclovir (VALTREX) 500 MG tablet Take 500 mg by mouth daily as needed. Pt takes only during an outbreak     No  facility-administered medications prior to visit.    PAST MEDICAL HISTORY: Past Medical History  Diagnosis Date  . Calculus of kidney   . Herpes   . Headache   . Classic migraine 03/28/2015  . Family history of cerebral aneurysm 03/28/2015  . Elevated blood pressure     PAST SURGICAL HISTORY: Past Surgical History  Procedure Laterality Date  . Cesarean section    . Leep    . Cesarean section  07/02/2012    Procedure: CESAREAN SECTION;  Surgeon: Robley Fries, MD;  Location: [facility];  Service: Obstetrics;  Laterality: N/A;  EDD: 07/27/12/Repeat  . Anterior cruciate ligament repair      FAMILY HISTORY: Family History  Problem Relation Age of Onset  . Aneurysm Maternal Aunt     cerebral  . Migraines Maternal Aunt   . Diabetes Paternal Uncle   . Diabetes Maternal Grandmother   . Melanoma Maternal Grandmother   . Prostate cancer Paternal Grandfather   . Migraines Mother   . Migraines Sister   . Aneurysm Maternal Aunt   . Stroke Maternal Grandmother   . High blood pressure Paternal Grandmother     SOCIAL HISTORY: Social History   Social History  . Marital Status: Married    Spouse Name: N/A  . Number of Children: 1  . Years of Education: BS   Occupational History  . Not on file.   Social History Main Topics  . Smoking status: Never Smoker   . Smokeless tobacco: Never Used  . Alcohol Use: 2.4 - 3.6 oz/week    4-6 Glasses of wine per week  . Drug Use: No  . Sexual Activity: Not on file   Other Topics Concern  . Not on file   Social History Narrative   Patient drinks 1-2 cups of caffeine daily.   Patient is right handed.      PHYSICAL EXAM  Filed Vitals:   07/09/15 1357  BP: 102/65  Pulse: 58  Height:  (1.575 m)  Weight: 141 lb (63.957 kg)   Body mass index is 25.78 kg/(m^2).  Generalized: Well developed, in no acute distress   Neurological examination  Mentation: Alert oriented to time, place, history taking. Follows all commands speech  and language fluent Cranial nerve II-XII: Pupils were equal round reactive to light. Extraocular movements were full, visual field were full on confrontational test. Facial sensation and strength were normal. Uvula tongue midline. Head turning and shoulder shrug  were normal and symmetric. Motor: The motor testing reveals 5 over 5 strength of all 4 extremities. Good symmetric motor tone is noted throughout.  Sensory: Sensory testing is intact to soft touch on all 4 extremities. No evidence of extinction is noted.  Coordination: Cerebellar testing reveals good finger-nose-finger and heel-to-shin bilaterally.  Gait and station: Gait is normal. Tandem gait is normal. Romberg is negative. No drift is seen.  Reflexes: Deep tendon reflexes are symmetric and normal bilaterally.   DIAGNOSTIC DATA (LABS, IMAGING, TESTING) - I reviewed patient records, labs, notes, testing and imaging  myself where available.  Lab Results  Component Value Date   WBC 5.9 04/23/2015   HGB 13.5 04/23/2015   HCT 39.9 04/23/2015   MCV 85.7 04/23/2015   PLT 310.0 04/23/2015      Component Value Date/Time   NA 140 06/22/2015 1414   K 3.7 06/22/2015 1414   CL 110 06/22/2015 1414   CO2 21 06/22/2015 1414   GLUCOSE 90 06/22/2015 1414   BUN 14 06/22/2015 1414   CREATININE 0.81 06/22/2015 1414   CREATININE 0.83 05/23/2015 0754   CALCIUM 8.9 06/22/2015 1414   PROT 7.0 03/12/2015 2200   ALBUMIN 4.4 03/12/2015 2200   AST 16 03/12/2015 2200   ALT 14 03/12/2015 2200   ALKPHOS 67 03/12/2015 2200   BILITOT 0.2* 03/12/2015 2200   GFRNONAA >60 03/12/2015 2200   GFRAA >60 03/12/2015 2200      ASSESSMENT AND PLAN 39 y.o. year old female  has a past medical history of Calculus of kidney; Herpes; Headache; Classic migraine (03/28/2015); Family history of cerebral aneurysm (03/28/2015); and Elevated blood pressure. here with:  1. Migraine headache  Overall the patient has seen great improvement with Topamax. She will  continue Topamax 100 mg at bedtime. I have advised the patient to try to take Advil for her current headache. If she continues to have a headache tomorrow morning she should let us know. Patient verbalized understanding. She will follow-up in 4-5 months or sooner if needed.    Butch Penny, MSN, NP-C 07/09/2015, 2:30 PM [facility] [address], [address] [place], [place] [address] [phone]

## 2015-07-09 NOTE — Patient Instructions (Signed)
Continue Topamax 100 mg daily. If headache does not subside with advil by tomorrow AM please call.  If your symptoms worsen or you develop new symptoms please let us know.

## 2015-07-09 NOTE — Progress Notes (Signed)
I have read the note, and I agree with the clinical assessment and plan.  Lakedra Washington KEITH   

## 2015-08-24 ENCOUNTER — Ambulatory Visit (INDEPENDENT_AMBULATORY_CARE_PROVIDER_SITE_OTHER): Payer: 59 | Admitting: Cardiology

## 2015-08-24 ENCOUNTER — Ambulatory Visit: Payer: Self-pay | Admitting: Cardiology

## 2015-08-24 ENCOUNTER — Encounter: Payer: Self-pay | Admitting: Cardiology

## 2015-08-24 VITALS — BP 100/70 | HR 69 | Ht 62.0 in | Wt 142.8 lb

## 2015-08-24 DIAGNOSIS — I10 Essential (primary) hypertension: Secondary | ICD-10-CM | POA: Diagnosis not present

## 2015-08-24 DIAGNOSIS — F419 Anxiety disorder, unspecified: Secondary | ICD-10-CM

## 2015-08-24 HISTORY — DX: Anxiety disorder, unspecified: F41.9

## 2015-08-24 NOTE — Progress Notes (Signed)
Cardiology Office Note   Date:  08/24/2015   ID:  Ashley Rammingaige Lal, DOB 01/11/1976, MRN [id number]  PCP:  Jarrett SohoWharton, Courtney, PA-C    Chief Complaint  Patient presents with  . Follow-up    pt has no complaints      History of Present Illness: Ashley Horne is a 39 y.o. female who presents today for a follow up visit for HTN and possible FMD of renal arteries.  She was referred to Dr. Edilia Boickson with [facility] who felt that she she did not have any evidence of FMD on CT scan.  Her PCP thought some of her BP issues may be due to anxiety and started her on Zoloft.  Her BP has become very well controlled with systolic in the 100-16710mmHg range.  She is doing well from a cardiac standpoint.  She denies any chest pain, SOB, DOE, LE edema, dizziness, palpitations or syncope.     Past Medical History  Diagnosis Date  . Calculus of kidney   . Herpes   . Headache   . Classic migraine 03/28/2015  . Family history of cerebral aneurysm 03/28/2015  . Elevated blood pressure     Past Surgical History  Procedure Laterality Date  . Cesarean section    . Leep    . Cesarean section  07/02/2012    Procedure: CESAREAN SECTION;  Surgeon: Robley FriesVaishali R Mody, MD;  Location: [facility];  Service: Obstetrics;  Laterality: N/A;  EDD: 07/27/12/Repeat  . Anterior cruciate ligament repair       Current Outpatient Prescriptions  Medication Sig Dispense Refill  . ALPRAZolam (XANAX) 0.5 MG tablet Take 0.5 mg by mouth 3 (three) times daily as needed for anxiety.   0  . hydrochlorothiazide (HYDRODIURIL) 25 MG tablet Take 0.5 tablets (12.5 mg total) by mouth daily. 30 tablet 3  . ibuprofen (ADVIL,MOTRIN) 200 MG tablet Take 200 mg by mouth every 6 (six) hours as needed.    . potassium chloride SA (K-DUR,KLOR-CON) 20 MEQ tablet Take 2 tablets (40 mEq total) by mouth daily. 60 tablet 6  . sertraline (ZOLOFT) 50 MG tablet Take 1 tablet by mouth daily.    Marland Kitchen. topiramate (TOPAMAX) 100 MG tablet Take 1 tablet (100  mg total) by mouth at bedtime. 30 tablet 3  . valACYclovir (VALTREX) 500 MG tablet Take 500 mg by mouth daily as needed. Pt takes only during an outbreak     No current facility-administered medications for this visit.    Allergies:   Review of patient's allergies indicates no known allergies.    Social History:  The patient  reports that she has never smoked. She has never used smokeless tobacco. She reports that she drinks about 2.4 - 3.6 oz of alcohol per week. She reports that she does not use illicit drugs.   Family History:  The patient's family history includes Aneurysm in her maternal aunt and maternal aunt; Diabetes in her maternal grandmother and paternal uncle; High blood pressure in her paternal grandmother; Melanoma in her maternal grandmother; Migraines in her maternal aunt, mother, and sister; Prostate cancer in her paternal grandfather; Stroke in her maternal grandmother.    ROS:  Please see the history of present illness.   Otherwise, review of systems are positive for none.   All other systems are reviewed and negative.    PHYSICAL EXAM: VS:  BP 100/70 mmHg  Pulse 69  Ht 5\' 2"  (  1.575 m)  Wt 142 lb 12.8 oz (64.774 kg)  BMI 26.11 kg/m2  SpO2 99% , BMI Body mass index is 26.11 kg/(m^2). GEN: Well nourished, well developed, in no acute distress HEENT: normal Neck: no JVD, carotid bruits, or masses Cardiac: RRR; no murmurs, rubs, or gallops,no edema  Respiratory:  clear to auscultation bilaterally, normal work of breathing GI: soft, nontender, nondistended, + BS MS: no deformity or atrophy Skin: warm and dry, no rash Neuro:  Strength and sensation are intact Psych: euthymic mood, full affect   EKG:  EKG is not ordered today.    Recent Labs: 03/12/2015: ALT 14 04/23/2015: Hemoglobin 13.5; Platelets 310.0 06/22/2015: BUN 14; Creat 0.81; Potassium 3.7; Sodium 140    Lipid Panel No results found for: CHOL, TRIG, HDL, CHOLHDL, VLDL, LDLCALC, LDLDIRECT    Wt  Readings from Last 3 Encounters:  08/24/15 142 lb 12.8 oz (64.774 kg)  07/09/15 141 lb (63.957 kg)  05/16/15 139 lb (63.05 kg)      Assessment/Plan: 1. HTN/elevated BP - her BP has improved nicely. Echo basically normal. Initially felt to have CTA with early FMD. Referred to [facility] and Dr. Edilia Bo felt she did not have FMD.  Her BP is very well controlled and actually on the soft side on HCTZ.  At home her BP is in the low 100's systolic.  She attributes the better control to her going on anxiety medication (Zoloft).  I have recommended that she stop the HCTZ and potassium suppl and check BP daily for a week and call with results.   2. Migraine/headache - on Topamax  3.  Anxiety - improved on   Current medicines are reviewed at length with the patient today.  The patient does not have concerns regarding medicines.  The following changes have been made:  no change  Labs/ tests ordered today: See above Assessment and Plan No orders of the defined types were placed in this encounter.     Disposition:   FU with me in 1 year  Signed, Quintella Reichert, MD  08/24/2015 3:09 PM    [facility] [address] [place], [place], [place]  [address] Phone: [phone]; Fax: [phone]

## 2015-08-24 NOTE — Patient Instructions (Signed)
Medication Instructions:  1) STOP HCTZ  2) STOP POTASSIUM  Labwork: None  Testing/Procedures: None  Follow-Up: Your physician wants you to follow-up in: 1 year with Dr. Mayford Knifeurner. You will receive a reminder letter in the mail two months in advance. If you don't receive a letter, please call our office to schedule the follow-up appointment.   Any Other Special Instructions Will Be Listed Below (If Applicable). Please check your BLOOD PRESSURE daily for one week and call with results.   If you need a refill on your cardiac medications before your next appointment, please call your pharmacy.

## 2015-08-30 ENCOUNTER — Other Ambulatory Visit: Payer: Self-pay | Admitting: Neurology

## 2015-09-02 ENCOUNTER — Other Ambulatory Visit: Payer: Self-pay

## 2015-09-02 MED ORDER — TOPIRAMATE 100 MG PO TABS: 100.0000 mg | ORAL_TABLET | Freq: Every day | ORAL | Status: DC

## 2015-10-19 ENCOUNTER — Telehealth: Payer: Self-pay | Admitting: Neurology

## 2015-10-19 DIAGNOSIS — G43919 Migraine, unspecified, intractable, without status migrainosus: Secondary | ICD-10-CM

## 2015-10-19 MED ORDER — ELETRIPTAN HYDROBROMIDE 40 MG PO TABS: 40.0000 mg | ORAL_TABLET | ORAL | Status: DC | PRN

## 2015-10-31 ENCOUNTER — Encounter: Payer: Self-pay | Admitting: Adult Health

## 2015-10-31 ENCOUNTER — Ambulatory Visit (INDEPENDENT_AMBULATORY_CARE_PROVIDER_SITE_OTHER): Payer: 59 | Admitting: Adult Health

## 2015-10-31 DIAGNOSIS — G43009 Migraine without aura, not intractable, without status migrainosus: Secondary | ICD-10-CM | POA: Diagnosis not present

## 2015-10-31 MED ORDER — ELETRIPTAN HYDROBROMIDE 40 MG PO TABS: 40.0000 mg | ORAL_TABLET | ORAL | Status: DC | PRN

## 2015-10-31 MED ORDER — TOPIRAMATE 100 MG PO TABS: 150.0000 mg | ORAL_TABLET | Freq: Every day | ORAL | Status: DC

## 2015-10-31 NOTE — Progress Notes (Signed)
PATIENT: Ashley Horne DOB: 24-Aug-1976  REASON FOR VISIT: follow up- migraine headache HISTORY FROM: patient  HISTORY OF PRESENT ILLNESS: Ms Ashley Horne is a 40 year old female with a history of migraine headaches. She returns today for follow-up. She states initially Topamax was very beneficial for her headaches. She states that at the beginning of the month she had a severe migraine and this is the first severe migraine she has since starting Topamax. She called our on-call physician and Relpax was called in. She states this medication was beneficial for her headache. The patient states that since February she had 4-5 headaches. Her headaches are not severe but they are frequent. She rates her pain a 5 on a scale of 1-10. Her headache is normally located across the forehead or in the temples. Denies photophobia, phonophobia, nausea and vomiting. Denies any changes with her vision. She returns today for an evaluation.  HISTORY 07/09/15: Ms Ashley Horne is a 40 year old female with a history of migraine headaches. She returns today for follow-up. She was started on Topamax 100 mg at bedtime. She reports that her headaches have improved significantly since starting Topamax. She reports that she has had approximately one headache since increasing her medication to 100 mg. She states that she may have other headaches such as a tension or stress related headache. She states that she normally can take Advil and her headache improves within 30-45 minutes. She does have a headache today. She states that she woke up with a headache however she has not taken any medication. She states as the day has progressed her headache has gotten slightly worse. She states that her headache is located above the eyes in the frontal region. Denies photophobia or phonophobia. She has had some mild nausea. She rates her pain as a 4 out of 10. She reports that she normally takes Advil and as a last resort she may have a BC powder. She  reports that she is not had a BC powder since July. She returns today for an evaluation.  REVIEW OF SYSTEMS: Out of a complete 14 system review of symptoms, the patient complains only of the following symptoms, and all other reviewed systems are negative.  Headache  ALLERGIES: No Known Allergies  HOME MEDICATIONS: Outpatient Prescriptions Prior to Visit  Medication Sig Dispense Refill  . ALPRAZolam (XANAX) 0.5 MG tablet Take 0.5 mg by mouth 3 (three) times daily as needed for anxiety.   0  . eletriptan (RELPAX) 40 MG tablet Take 1 tablet (40 mg total) by mouth as needed for migraine or headache. May repeat in 2 hours if headache persists or recurs. 10 tablet 0  . ibuprofen (ADVIL,MOTRIN) 200 MG tablet Take 200 mg by mouth every 6 (six) hours as needed.    . sertraline (ZOLOFT) 50 MG tablet Take 1 tablet by mouth daily.    Marland Kitchen topiramate (TOPAMAX) 100 MG tablet Take 1 tablet (100 mg total) by mouth at bedtime. 90 tablet 0  . valACYclovir (VALTREX) 500 MG tablet Take 500 mg by mouth daily as needed. Pt takes only during an outbreak     No facility-administered medications prior to visit.    PAST MEDICAL HISTORY: Past Medical History  Diagnosis Date  . Calculus of kidney   . Herpes   . Headache   . Classic migraine 03/28/2015  . Family history of cerebral aneurysm 03/28/2015  . Elevated blood pressure   . Anxiety 08/24/2015    PAST SURGICAL HISTORY: Past Surgical History  Procedure Laterality  Date  . Cesarean section    . Leep    . Cesarean section  07/02/2012    Procedure: CESAREAN SECTION;  Surgeon: Ashley Fries, MD;  Location: [facility];  Service: Obstetrics;  Laterality: N/A;  EDD: 07/27/12/Repeat  . Anterior cruciate ligament repair      FAMILY HISTORY: Family History  Problem Relation Age of Onset  . Aneurysm Maternal Aunt     cerebral  . Migraines Maternal Aunt   . Diabetes Paternal Uncle   . Diabetes Maternal Grandmother   . Melanoma Maternal Grandmother   .  Prostate cancer Paternal Grandfather   . Migraines Mother   . Migraines Sister   . Aneurysm Maternal Aunt   . Stroke Maternal Grandmother   . High blood pressure Paternal Grandmother     SOCIAL HISTORY: Social History   Social History  . Marital Status: Married    Spouse Name: N/A  . Number of Children: 1  . Years of Education: BS   Occupational History  . Not on file.   Social History Main Topics  . Smoking status: Never Smoker   . Smokeless tobacco: Never Used  . Alcohol Use: 2.4 - 3.6 oz/week    4-6 Glasses of wine per week  . Drug Use: No  . Sexual Activity: Not on file   Other Topics Concern  . Not on file   Social History Narrative   Patient drinks 1-2 cups of caffeine daily.   Patient is right handed.      PHYSICAL EXAM  Filed Vitals:   10/31/15 1426  BP: 119/75  Pulse: 73  Height:  (1.575 m)  Weight: 144 lb (65.318 kg)   Body mass index is 26.33 kg/(m^2).  Generalized: Well developed, in no acute distress   Neurological examination  Mentation: Alert oriented to time, place, history taking. Follows all commands speech and language fluent Cranial nerve II-XII: Pupils were equal round reactive to light. Extraocular movements were full, visual field were full on confrontational test. Facial sensation and strength were normal. Uvula tongue midline. Head turning and shoulder shrug  were normal and symmetric. Motor: The motor testing reveals 5 over 5 strength of all 4 extremities. Good symmetric motor tone is noted throughout.  Sensory: Sensory testing is intact to soft touch on all 4 extremities. No evidence of extinction is noted.  Coordination: Cerebellar testing reveals good finger-nose-finger and heel-to-shin bilaterally.  Gait and station: Gait is normal. Tandem gait is normal. Romberg is negative. No drift is seen.  Reflexes: Deep tendon reflexes are symmetric and normal bilaterally.   DIAGNOSTIC DATA (LABS, IMAGING, TESTING) - I reviewed  patient records, labs, notes, testing and imaging myself where available.  Lab Results  Component Value Date   WBC 5.9 04/23/2015   HGB 13.5 04/23/2015   HCT 39.9 04/23/2015   MCV 85.7 04/23/2015   PLT 310.0 04/23/2015      Component Value Date/Time   NA 140 06/22/2015 1414   K 3.7 06/22/2015 1414   CL 110 06/22/2015 1414   CO2 21 06/22/2015 1414   GLUCOSE 90 06/22/2015 1414   BUN 14 06/22/2015 1414   CREATININE 0.81 06/22/2015 1414   CREATININE 0.83 05/23/2015 0754   CALCIUM 8.9 06/22/2015 1414   PROT 7.0 03/12/2015 2200   ALBUMIN 4.4 03/12/2015 2200   AST 16 03/12/2015 2200   ALT 14 03/12/2015 2200   ALKPHOS 67 03/12/2015 2200   BILITOT 0.2* 03/12/2015 2200   GFRNONAA >60 03/12/2015 2200  GFRAA >60 03/12/2015 2200      ASSESSMENT AND PLAN 40 y.o. year old female  has a past medical history of Calculus of kidney; Herpes; Headache; Classic migraine (03/28/2015); Family history of cerebral aneurysm (03/28/2015); Elevated blood pressure; and Anxiety (08/24/2015). here with:  1. Migraine headache  The patient will increase Topamax to 150 mg daily. I instructed the patient she can take 1-1/2 tablets at bedtime or she can split her dose and take half a tablet in the morning and 1 tablet at bedtime. Patient verbalized understanding. She was instructed that she can continue to use Relpax for her migraines. If her headaches do not improve she should let us know. She will follow-up in 4 months or sooner if needed.   Butch Penny, MSN, NP-C 10/31/2015, 2:30 PM [facility] [address] [place], [place] [address] [phone]

## 2015-10-31 NOTE — Patient Instructions (Signed)
Increase Topamax to 150 mg (1.5 tablets daily) Continue Relpax as needed for migraines If your symptoms worsen or you develop new symptoms please let us know.

## 2015-10-31 NOTE — Progress Notes (Signed)
I have read the note, and I agree with the clinical assessment and plan.  Hollister Wessler KEITH   

## 2015-11-08 ENCOUNTER — Ambulatory Visit: Payer: 59 | Admitting: Adult Health

## 2016-01-28 ENCOUNTER — Encounter: Payer: Self-pay | Admitting: Adult Health

## 2016-01-28 ENCOUNTER — Ambulatory Visit (INDEPENDENT_AMBULATORY_CARE_PROVIDER_SITE_OTHER): Payer: 59 | Admitting: Adult Health

## 2016-01-28 VITALS — BP 110/70 | HR 61 | Ht 62.0 in | Wt 143.2 lb

## 2016-01-28 DIAGNOSIS — G43009 Migraine without aura, not intractable, without status migrainosus: Secondary | ICD-10-CM | POA: Diagnosis not present

## 2016-01-28 MED ORDER — ELETRIPTAN HYDROBROMIDE 40 MG PO TABS: 40.0000 mg | ORAL_TABLET | ORAL | Status: DC | PRN

## 2016-01-28 MED ORDER — TOPIRAMATE 100 MG PO TABS: 200.0000 mg | ORAL_TABLET | Freq: Every day | ORAL | Status: DC

## 2016-01-28 NOTE — Patient Instructions (Signed)
Increase Topamax to 200 mg daily  Continue Relpax as needed If your symptoms worsen or you develop new symptoms please let us know.

## 2016-01-28 NOTE — Progress Notes (Signed)
PATIENT: Ashley Horne DOB: [birth year]  REASON FOR VISIT: follow up- migraine headache HISTORY FROM: patient  HISTORY OF PRESENT ILLNESS: Ashley Horne is a 40 year old female with a history of migraine headaches. She returns today for follow-up. At the last visit Topamax was increased to 150 mg daily. She reports that she has noticed some benefit. She states that she has 3-4 migraines a month but can have a dull headache that lasts up to 3 weeks. She states that the dull headaches normally come in clusters. She states that she can go 3 weeks with no headaches then have a dull headache that lasts for weeks at a time. She states that her headaches are normally located across the forehead. If she begins to have discomfort in the temporals bilaterally this reflects her migraines. With her migraines she does have a photophobia and phonophobia as well as nausea. She states that she does get some benefit with Relpax. She is questioning if her symptoms could be related to allergies however she does not have rhinitis, sneezing or watery eyes. She returns today for an evaluation.  HISTORY 10/31/15 (MM): Ashley Horne is a 40 year old female with a history of migraine headaches. She returns today for follow-up. She states initially Topamax was very beneficial for her headaches. She states that at the beginning of the month she had a severe migraine and this is the first severe migraine she has since starting Topamax. She called our on-call physician and Relpax was called in. She states this medication was beneficial for her headache. The patient states that since February she had 4-5 headaches. Her headaches are not severe but they are frequent. She rates her pain a 5 on a scale of 1-10. Her headache is normally located across the forehead or in the temples. Denies photophobia, phonophobia, nausea and vomiting. Denies any changes with her vision. She returns today for an evaluation.  HISTORY 07/09/15: Ashley Horne is  a 40 year old female with a history of migraine headaches. She returns today for follow-up. She was started on Topamax 100 mg at bedtime. She reports that her headaches have improved significantly since starting Topamax. She reports that she has had approximately one headache since increasing her medication to 100 mg. She states that she may have other headaches such as a tension or stress related headache. She states that she normally can take Advil and her headache improves within 30-45 minutes. She does have a headache today. She states that she woke up with a headache however she has not taken any medication. She states as the day has progressed her headache has gotten slightly worse. She states that her headache is located above the eyes in the frontal region. Denies photophobia or phonophobia. She has had some mild nausea. She rates her pain as a 4 out of 10. She reports that she normally takes Advil and as a last resort she may have a BC powder. She reports that she is not had a BC powder since July. She returns today for an evaluation.   REVIEW OF SYSTEMS: Out of a complete 14 system review of symptoms, the patient complains only of the following symptoms, and all other reviewed systems are negative.  See history of present illness  ALLERGIES: No Known Allergies  HOME MEDICATIONS: Outpatient Prescriptions Prior to Visit  Medication Sig Dispense Refill  . ALPRAZolam (XANAX) 0.5 MG tablet Take 0.5 mg by mouth 3 (three) times daily as needed for anxiety.   0  . ibuprofen (ADVIL,MOTRIN) 200 MG  tablet Take 200 mg by mouth every 6 (six) hours as needed.    . sertraline (ZOLOFT) 50 MG tablet Take 1 tablet by mouth daily.    . valACYclovir (VALTREX) 500 MG tablet Take 500 mg by mouth daily as needed. Pt takes only during an outbreak    . eletriptan (RELPAX) 40 MG tablet Take 1 tablet (40 mg total) by mouth as needed for migraine or headache. May repeat in 2 hours if headache persists or recurs. 10  tablet 3  . topiramate (TOPAMAX) 100 MG tablet Take 1.5 tablets (150 mg total) by mouth at bedtime. 135 tablet 3   No facility-administered medications prior to visit.    PAST MEDICAL HISTORY: Past Medical History  Diagnosis Date  . Calculus of kidney   . Herpes   . Headache   . Classic migraine 03/28/2015  . Family history of cerebral aneurysm 03/28/2015  . Elevated blood pressure   . Anxiety 08/24/2015    PAST SURGICAL HISTORY: Past Surgical History  Procedure Laterality Date  . Cesarean section    . Leep    . Cesarean section  07/02/2012    Procedure: CESAREAN SECTION;  Surgeon: Robley Fries, MD;  Location: [facility];  Service: Obstetrics;  Laterality: N/A;  EDD: 07/27/12/Repeat  . Anterior cruciate ligament repair      FAMILY HISTORY: Family History  Problem Relation Age of Onset  . Aneurysm Maternal Aunt     cerebral  . Migraines Maternal Aunt   . Diabetes Paternal Uncle   . Diabetes Maternal Grandmother   . Melanoma Maternal Grandmother   . Prostate cancer Paternal Grandfather   . Migraines Mother   . Migraines Sister   . Aneurysm Maternal Aunt   . Stroke Maternal Grandmother   . High blood pressure Paternal Grandmother     SOCIAL HISTORY: Social History   Social History  . Marital Status: Married    Spouse Name: N/A  . Number of Children: 1  . Years of Education: BS   Occupational History  . Not on file.   Social History Main Topics  . Smoking status: Never Smoker   . Smokeless tobacco: Never Used  . Alcohol Use: 2.4 - 3.6 oz/week    4-6 Glasses of wine per week  . Drug Use: No  . Sexual Activity: Not on file   Other Topics Concern  . Not on file   Social History Narrative   Patient drinks 1-2 cups of caffeine daily.   Patient is right handed.      PHYSICAL EXAM  Filed Vitals:   01/28/16 1500  BP: 110/70  Pulse: 61  Height: 5\' 2"  (1.575 m)  Weight: 143 lb 3.2 oz (64.955 kg)   Body mass index is 26.18 kg/(m^2).  Generalized:  Well developed, in no acute distress   Neurological examination  Mentation: Alert oriented to time, place, history taking. Follows all commands speech and language fluent Cranial nerve II-XII: Pupils were equal round reactive to light. Extraocular movements were full, visual field were full on confrontational test. Facial sensation and strength were normal. Uvula tongue midline. Head turning and shoulder shrug  were normal and symmetric. Motor: The motor testing reveals 5 over 5 strength of all 4 extremities. Good symmetric motor tone is noted throughout.  Sensory: Sensory testing is intact to soft touch on all 4 extremities. No evidence of extinction is noted.  Coordination: Cerebellar testing reveals good finger-nose-finger and heel-to-shin bilaterally.  Gait and station: Gait is normal. Tandem  gait is normal. Romberg is negative. No drift is seen.  Reflexes: Deep tendon reflexes are symmetric and normal bilaterally.   DIAGNOSTIC DATA (LABS, IMAGING, TESTING) - I reviewed patient records, labs, notes, testing and imaging myself where available.  Lab Results  Component Value Date   WBC 5.9 04/23/2015   HGB 13.5 04/23/2015   HCT 39.9 04/23/2015   MCV 85.7 04/23/2015   PLT 310.0 04/23/2015      Component Value Date/Time   NA 140 06/22/2015 1414   K 3.7 06/22/2015 1414   CL 110 06/22/2015 1414   CO2 21 06/22/2015 1414   GLUCOSE 90 06/22/2015 1414   BUN 14 06/22/2015 1414   CREATININE 0.81 06/22/2015 1414   CREATININE 0.83 05/23/2015 0754   CALCIUM 8.9 06/22/2015 1414   PROT 7.0 03/12/2015 2200   ALBUMIN 4.4 03/12/2015 2200   AST 16 03/12/2015 2200   ALT 14 03/12/2015 2200   ALKPHOS 67 03/12/2015 2200   BILITOT 0.2* 03/12/2015 2200   GFRNONAA >60 03/12/2015 2200   GFRAA >60 03/12/2015 2200      ASSESSMENT AND PLAN 40 y.o. year old female  has a past medical history of Calculus of kidney; Herpes; Headache; Classic migraine (03/28/2015); Family history of cerebral aneurysm  (03/28/2015); Elevated blood pressure; and Anxiety (08/24/2015). here with:  1. Migraine headache  The patient continues to have dull headaches that can last for several weeks. I will increase Topamax to 200 mg daily. She will continue using Relpax as needed. If this is not beneficial we will consider adding an additional medication. Patient verbalizes understanding. She will follow-up in 3 months or sooner if needed.     Butch PennyMegan Cristol Engdahl, MSN, NP-C 01/28/2016, 3:30 PM [facility] [address], [address] [place], [place] [phone]

## 2016-01-28 NOTE — Progress Notes (Signed)
I have read the note, and I agree with the clinical assessment and plan.  Ashley Horne,Ashley Horne   

## 2016-04-28 ENCOUNTER — Telehealth: Payer: Self-pay

## 2016-04-28 NOTE — Telephone Encounter (Signed)
I spoke to patient, Aundra MilletMegan NP will be out of the office tomorrow for emergency. Patient was able to reschedule for next week.

## 2016-04-29 ENCOUNTER — Ambulatory Visit: Payer: 59 | Admitting: Adult Health

## 2016-05-06 ENCOUNTER — Ambulatory Visit: Payer: Self-pay | Admitting: Adult Health

## 2016-05-18 IMAGING — MR MR HEAD W/O CM
10 series · 48 of 48 positions shown · non-contrast
Comparison: None.

CLINICAL DATA: Headache

EXAM:
MRI HEAD WITHOUT CONTRAST
TECHNIQUE: Multiplanar, multiecho pulse sequences of the brain and surrounding
structures were obtained without intravenous contrast.

[Series 2: T1 · sagittal · 5.0mm · 0.45mm/px · 2 of 20 slices shown]
[im 1/20]
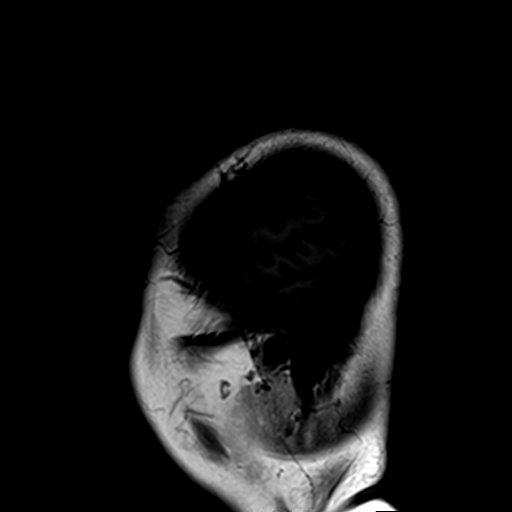
[im 20/20]
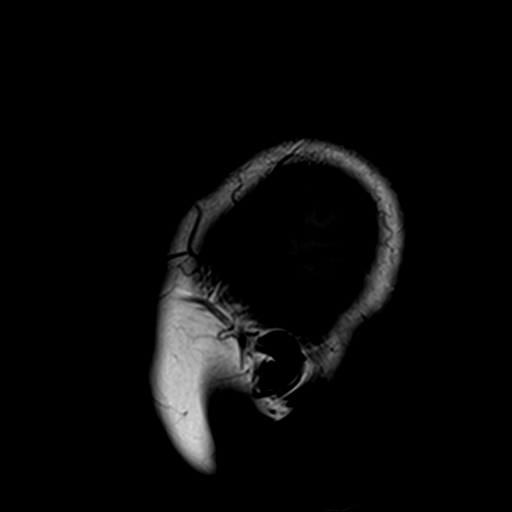

[Series 3: DWI · axial · 3.0mm · 1.80mm/px · z∈[-77,+69]mm · 10 of 100 slices shown (1 of 4)]
[im 1/100]
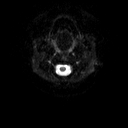
[im 12/100]
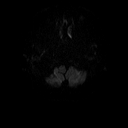
[im 23/100]
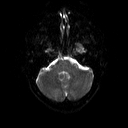
[im 34/100]
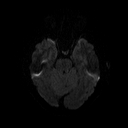
[im 45/100]
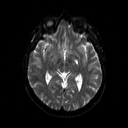
[im 56/100]
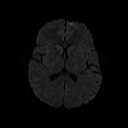
[im 67/100]
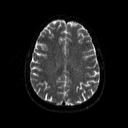
[im 78/100]
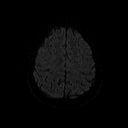
[im 89/100]
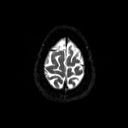
[im 100/100]
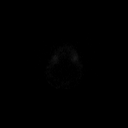

[Series 4: DWI · axial · 3.0mm · 1.80mm/px · z∈[-77,+69]mm · 5 of 47 slices shown (2 of 4)]
[im 1/47]
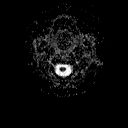
[im 12/47]
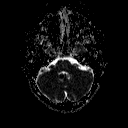
[im 24/47]
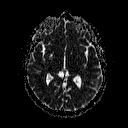
[im 35/47]
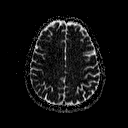
[im 47/47]
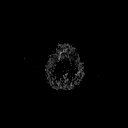

[Series 6: swi_images · axial · 2.0mm · 0.90mm/px · z∈[-78,+78]mm · 8 of 80 slices shown]
[im 1/80]
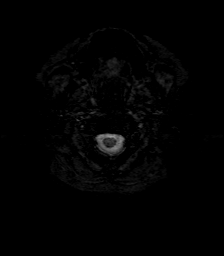
[im 12/80]
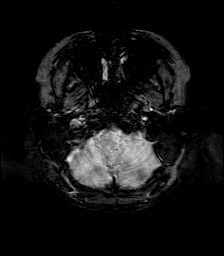
[im 23/80]
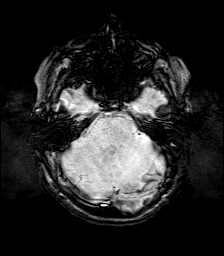
[im 34/80]
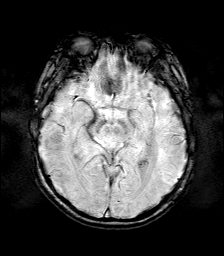
[im 46/80]
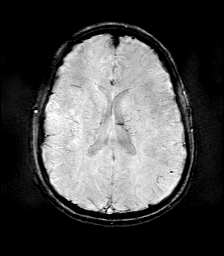
[im 57/80]
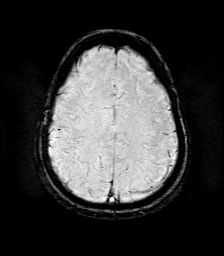
[im 68/80]
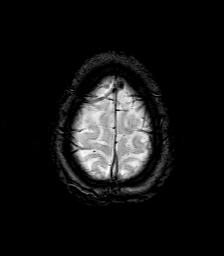
[im 80/80]
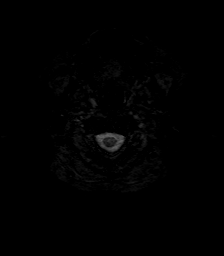

[Series 7: DWI · coronal · 5.0mm · 1.80mm/px · 6 of 60 slices shown (3 of 4)]
[im 1/60]
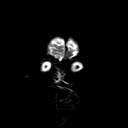
[im 12/60]
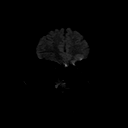
[im 24/60]
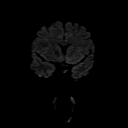
[im 36/60]
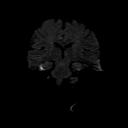
[im 48/60]
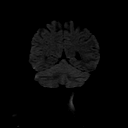
[im 60/60]
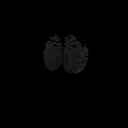

[Series 8: DWI · coronal · 5.0mm · 1.80mm/px · 3 of 30 slices shown (4 of 4)]
[im 1/30]
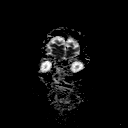
[im 15/30]
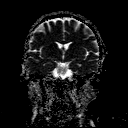
[im 30/30]
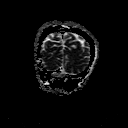

[Series 9: T2 · axial · 5.0mm · 0.45mm/px · z∈[-83,+50]mm · 2 of 22 slices shown (1 of 2)]
[im 1/22]
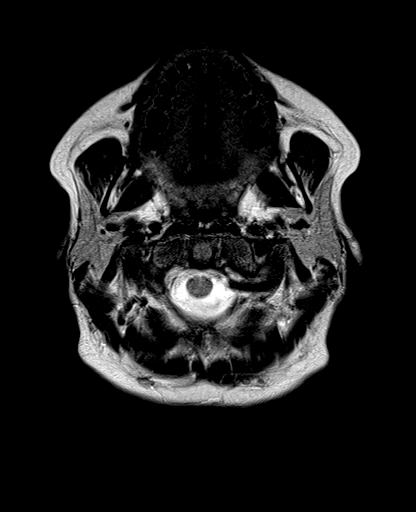
[im 22/22]
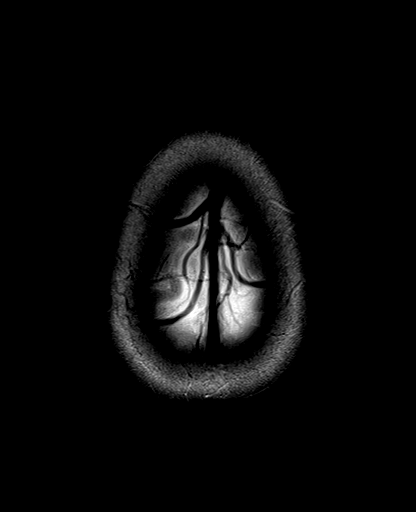

[Series 10: FLAIR · axial · 5.0mm · 0.45mm/px · z∈[-83,+50]mm · 2 of 22 slices shown]
[im 1/22]
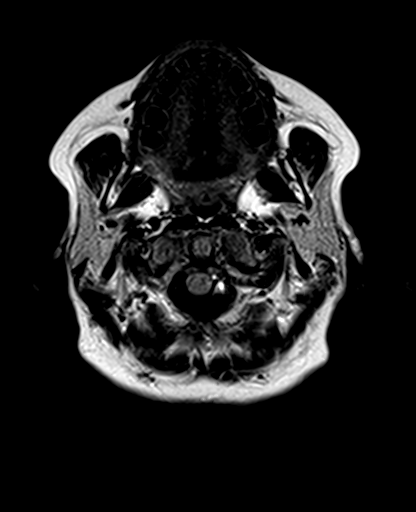
[im 22/22]
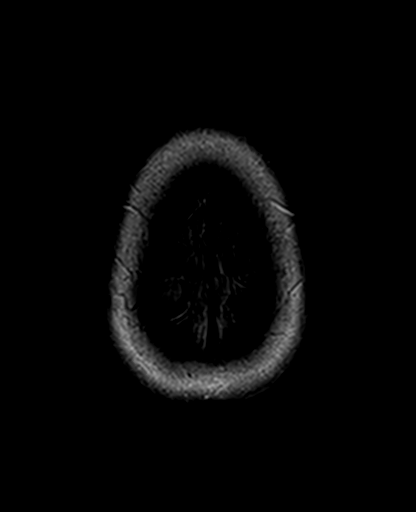

[Series 11: mpr tra · axial · 2.0mm · 0.45mm/px · z∈[-94,+61]mm · 8 of 80 slices shown]
[im 1/80]
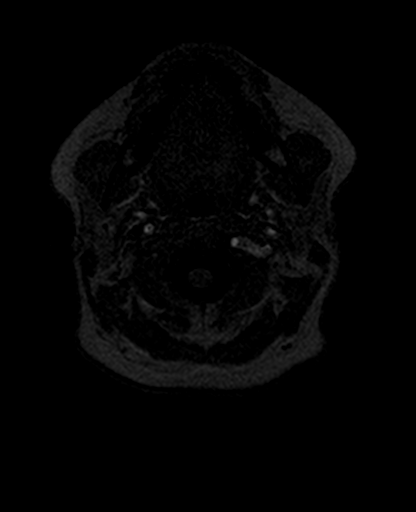
[im 12/80]
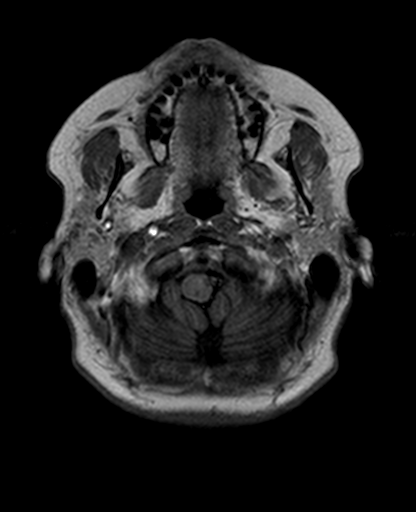
[im 23/80]
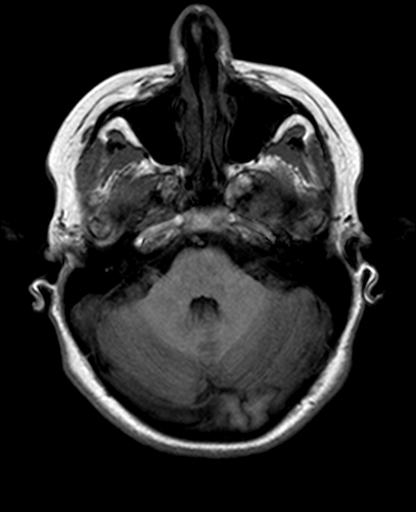
[im 34/80]
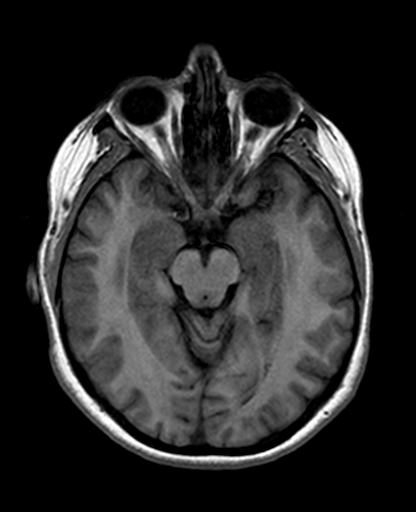
[im 46/80]
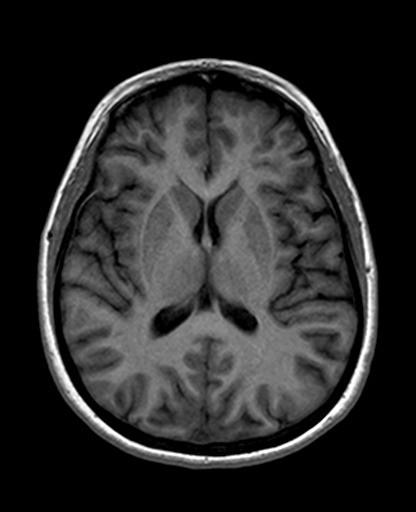
[im 57/80]
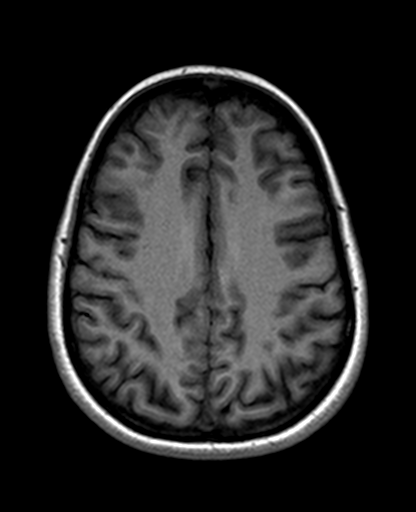
[im 68/80]
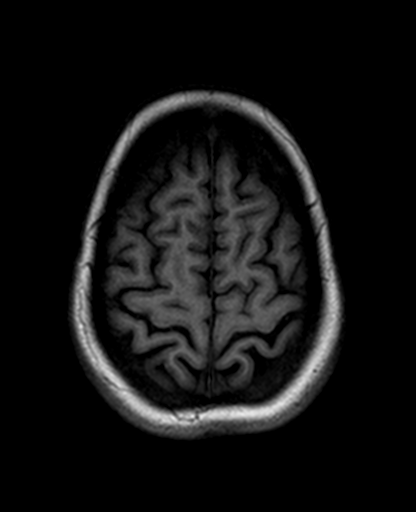
[im 80/80]
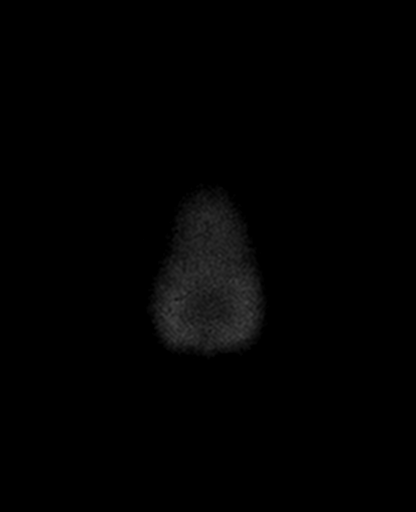

[Series 12: T2 · coronal · 5.0mm · 0.45mm/px · 2 of 22 slices shown (2 of 2)]
[im 1/22]
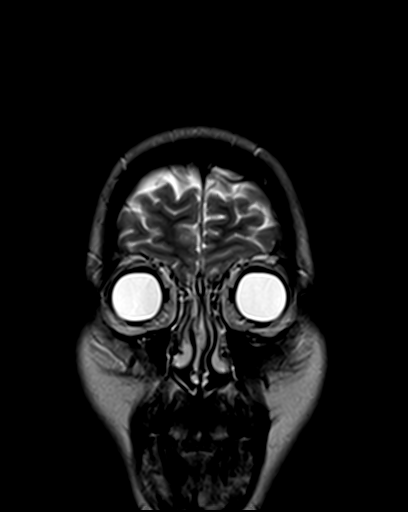
[im 22/22]
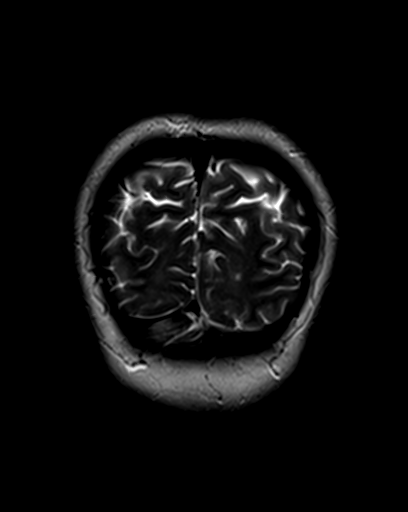

[48 of 48 positions shown; findings below may reference images not displayed]

FINDINGS: Ventricle size normal. Cerebral volume normal. Pituitary normal in
size. Normal orbit. Minimal mucosal edema in the paranasal sinuses
without air-fluid level.

Negative for acute or chronic infarction

Negative for demyelinating disease. Cerebral white matter is normal.

Negative for hemorrhage or mass lesion.  Negative for brain edema.
IMPRESSION: Normal MRI of the brain without contrast.

## 2016-07-31 ENCOUNTER — Encounter: Payer: Self-pay | Admitting: Neurology

## 2016-07-31 ENCOUNTER — Ambulatory Visit (INDEPENDENT_AMBULATORY_CARE_PROVIDER_SITE_OTHER): Payer: 59 | Admitting: Neurology

## 2016-07-31 VITALS — BP 130/83 | HR 77 | Ht 62.0 in | Wt 151.0 lb

## 2016-07-31 DIAGNOSIS — G43111 Migraine with aura, intractable, with status migrainosus: Secondary | ICD-10-CM | POA: Diagnosis not present

## 2016-07-31 MED ORDER — RIZATRIPTAN BENZOATE 10 MG PO TBDP: 10.0000 mg | ORAL_TABLET | Freq: Three times a day (TID) | ORAL | 3 refills | Status: DC | PRN

## 2016-07-31 NOTE — Progress Notes (Signed)
Reason for visit: Headache  Ashley Horne is an 40 y.o. female  History of present illness:  Ashley Horne is a 40 year old right-handed white female with a history of migraine headaches. The patient has on average one headache a week, she was placed on Topamax but this did not really help the headache frequency and in higher doses resulted in cognitive side effects. The patient has gone off the medication, she also went off of Zoloft but this resulted in increased moodiness and irritability. She went back on Zoloft and has also been placed on Wellbutrin. The patient is trying to avoid stress and dehydration which may be triggers for her headache. She has noted that Relpax helps her headaches, but she gets a tight and uncomfortable sensation in the jaws as the medication starts to work. She returns to this office for an evaluation.    Past Medical History:  Diagnosis Date  . Anxiety 08/24/2015  . Calculus of kidney   . Classic migraine 03/28/2015  . Elevated blood pressure   . Family history of cerebral aneurysm 03/28/2015  . Headache   . Herpes     Past Surgical History:  Procedure Laterality Date  . ANTERIOR CRUCIATE LIGAMENT REPAIR    . CESAREAN SECTION    . CESAREAN SECTION  07/02/2012   Procedure: CESAREAN SECTION;  Surgeon: Robley FriesVaishali R Mody, MD;  Location: [facility];  Service: Obstetrics;  Laterality: N/A;  EDD: 07/27/12/Repeat  . LEEP      Family History  Problem Relation Age of Onset  . Aneurysm Maternal Aunt     cerebral  . Migraines Maternal Aunt   . Migraines Mother   . Migraines Sister   . Aneurysm Maternal Aunt   . Diabetes Paternal Uncle   . Diabetes Maternal Grandmother   . Melanoma Maternal Grandmother   . Stroke Maternal Grandmother   . Prostate cancer Paternal Grandfather   . High blood pressure Paternal Grandmother     Social history:  reports that she has never smoked. She has never used smokeless tobacco. She reports that she drinks about 2.4 - 3.6 oz  of alcohol per week . She reports that she does not use drugs.   No Known Allergies  Medications:  Prior to Admission medications   Medication Sig Start Date End Date Taking? Authorizing Provider  ALPRAZolam Prudy Feeler(XANAX) 0.5 MG tablet Take 0.5 mg by mouth 3 (three) times daily as needed for anxiety.  03/22/15  Yes Historical Provider, MD  buPROPion [facility](WELLBUTRIN) 75 MG tablet  07/25/16  Yes Historical Provider, MD  eletriptan (RELPAX) 40 MG tablet Take 1 tablet (40 mg total) by mouth as needed for migraine or headache. May repeat in 2 hours if headache persists or recurs. 01/28/16  Yes Butch PennyMegan Millikan, NP  ibuprofen (ADVIL,MOTRIN) 200 MG tablet Take 200 mg by mouth every 6 (six) hours as needed.   Yes Historical Provider, MD  sertraline (ZOLOFT) 50 MG tablet Take 75 mg by mouth daily.  07/03/15  Yes Historical Provider, MD  valACYclovir (VALTREX) 500 MG tablet Take 500 mg by mouth daily as needed. Pt takes only during an outbreak   Yes Historical Provider, MD    ROS:  Out of a complete 14 system review of symptoms, the patient complains only of the following symptoms, and all other reviewed systems are negative.  Headache  Blood pressure 130/83, pulse 77, height 5\' 2"  (1.575 m), weight 151 lb (68.5 kg).  Physical Exam  General: The patient is alert  and cooperative at the time of the examination.  Skin: No significant peripheral edema is noted.   Neurologic Exam  Mental status: The patient is alert and oriented x 3 at the time of the examination. The patient has apparent normal recent and remote memory, with an apparently normal attention span and concentration ability.   Cranial nerves: Facial symmetry is present. Speech is normal, no aphasia or dysarthria is noted. Extraocular movements are full. Visual fields are full.  Motor: The patient has good strength in all 4 extremities.  Sensory examination: Soft touch sensation is symmetric on the face, arms, and legs.  Coordination: The  patient has good finger-nose-finger and heel-to-shin bilaterally.  Gait and station: The patient has a normal gait. Tandem gait is normal. Romberg is negative. No drift is seen.  Reflexes: Deep tendon reflexes are symmetric.   Assessment/Plan:  1. Migraine headache  The patient has not tolerated the Topamax well, this also did not help her headaches much. The patient will be switched from Relpax to Maxalt. If the headaches worsen, we can always add another medication such as gabapentin or Zonegran. The patient will follow-up through this office in about 6 months, sooner if needed.  Marlan Palau. Keith Willis MD 07/31/2016 4:02 PM  [facility] [address] [place], [place] [address]  Phone [phone] Fax [phone]

## 2016-10-07 ENCOUNTER — Telehealth: Payer: Self-pay | Admitting: Neurology

## 2016-10-07 MED ORDER — DEXAMETHASONE 2 MG PO TABS: ORAL_TABLET | ORAL | 0 refills | Status: DC

## 2016-10-07 NOTE — Telephone Encounter (Signed)
Patient having migraines x3 days. Medications Relpax and rizatriptan (MAXALT-MLT) 10 MG disintegrating tablet have not helped. Can something else be called to [facility] on [address]. Please call patient and advise.

## 2016-10-07 NOTE — Telephone Encounter (Signed)
I called patient. She has had headaches over the last 2 days, headaches are worse in the evening hours, not fully responsive to Maxalt or Relpax. I will give her a trial on Depacon taper. She will call if she is not doing well.  Otherwise, her headache frequency has not increased, averaging about one a week. She is not on any daily medications for her headache.

## 2016-10-07 NOTE — Addendum Note (Signed)
Addended by: Stephanie AcreWILLIS, CHARLES on: 10/07/2016 06:14 PM   Modules accepted: Orders

## 2016-10-07 NOTE — Telephone Encounter (Signed)
Pt was seen in Nov for OV. She had stopped Tomapax due to sleepiness. She is on Zoloft and Wellbutrin and was changed from Relpax to Maxalt as recue med. This has not recently been helpful w/ symptoms.

## 2016-12-30 DIAGNOSIS — J018 Other acute sinusitis: Secondary | ICD-10-CM | POA: Diagnosis not present

## 2016-12-30 DIAGNOSIS — J301 Allergic rhinitis due to pollen: Secondary | ICD-10-CM | POA: Diagnosis not present

## 2017-03-31 ENCOUNTER — Telehealth: Payer: Self-pay | Admitting: Neurology

## 2017-03-31 MED ORDER — DEXAMETHASONE 2 MG PO TABS: ORAL_TABLET | ORAL | 0 refills | Status: DC

## 2017-03-31 NOTE — Telephone Encounter (Signed)
I called patient. The patient has had daily headaches for the last 2 weeks, she is rebounding off of the Maxalt and Advil.  She responded quite well to Decadron previously, I will give her trial on this again.

## 2017-03-31 NOTE — Telephone Encounter (Signed)
Pt calling to inform that she is experiencing a head ache for about 2 weeks off and on and would like to know if like the last time she called to report this, can something just be called in for her.

## 2017-03-31 NOTE — Addendum Note (Signed)
Addended by: York SpanielWILLIS, Erskine Steinfeldt K on: 03/31/2017 05:06 PM   Modules accepted: Orders

## 2017-04-19 DIAGNOSIS — J0111 Acute recurrent frontal sinusitis: Secondary | ICD-10-CM | POA: Diagnosis not present

## 2017-07-27 DIAGNOSIS — Z01419 Encounter for gynecological examination (general) (routine) without abnormal findings: Secondary | ICD-10-CM | POA: Diagnosis not present

## 2017-07-27 DIAGNOSIS — Z1231 Encounter for screening mammogram for malignant neoplasm of breast: Secondary | ICD-10-CM | POA: Diagnosis not present

## 2017-08-21 DIAGNOSIS — J329 Chronic sinusitis, unspecified: Secondary | ICD-10-CM | POA: Diagnosis not present

## 2017-09-18 DIAGNOSIS — H6980 Other specified disorders of Eustachian tube, unspecified ear: Secondary | ICD-10-CM | POA: Diagnosis not present

## 2017-09-18 DIAGNOSIS — J329 Chronic sinusitis, unspecified: Secondary | ICD-10-CM | POA: Diagnosis not present

## 2017-10-05 ENCOUNTER — Telehealth: Payer: Self-pay | Admitting: Neurology

## 2017-10-05 MED ORDER — DEXAMETHASONE 2 MG PO TABS: ORAL_TABLET | ORAL | 0 refills | Status: DC

## 2017-10-05 MED ORDER — RIZATRIPTAN BENZOATE 10 MG PO TBDP: 10.0000 mg | ORAL_TABLET | Freq: Three times a day (TID) | ORAL | 3 refills | Status: DC | PRN

## 2017-10-05 NOTE — Telephone Encounter (Signed)
Pt has called stating she has had a migraine since Friday.  She states the on call Dr that she spoke with mentioned an infusion.  Pt is asking for a call from Dr Clarisa KindredWillis's RN to discuss options

## 2017-10-05 NOTE — Telephone Encounter (Signed)
Called pt back. Scheduled work in appt for 10/27/17 at 730am. Pt verbalized understanding and appreciation for call.

## 2017-10-05 NOTE — Addendum Note (Signed)
Addended by: York SpanielWILLIS, Rubi Tooley K on: 10/05/2017 01:54 PM   Modules accepted: Orders

## 2017-10-05 NOTE — Telephone Encounter (Signed)
I called the patient.  The patient has had a headache for the last 3 days.  She apparently called Dr. Vickey Hugerohmeier yesterday, there is no note documenting this phone call.  Dr. Vickey Hugerohmeier told her to take ibuprofen, she did not call in any Decadron that has helped her previously for her prolonged headaches.  I will call in the Decadron and Maxalt.  She will need a revisit sometime in the next several weeks.  She will let me know if the Decadron is not effective, we will consider a Depacon injection at that point.

## 2017-10-06 DIAGNOSIS — G43111 Migraine with aura, intractable, with status migrainosus: Secondary | ICD-10-CM | POA: Diagnosis not present

## 2017-10-06 NOTE — Telephone Encounter (Signed)
I called the patient.  The patient is still having a headache, I will get her to come in for a Depacon and Toradol injection.

## 2017-10-06 NOTE — Telephone Encounter (Signed)
Pt has called back to inform that she has had zero relief from the medication she just started.  Pt would like to be scheduled for an infusion, please call

## 2017-10-08 NOTE — Telephone Encounter (Signed)
I called the patient.  The patient did well for a couple days after the Depacon injection, the headache is coming back today.  She is to take Maxalt and ibuprofen now.  If this does not help, we can repeat the Depacon injection.  When I see her on the revisit we may need to consider a daily medication to help prevent headache.  She will let me know if she needs a Depacon injection.

## 2017-10-08 NOTE — Telephone Encounter (Signed)
Pt has called back re: the infusion on Tues she said that on Wed the 23rd it helped mostly but the head aches have come back and she would like to know what medications to take or if she needs to be scheduled for another infusion.  Please call

## 2017-10-27 ENCOUNTER — Encounter: Payer: Self-pay | Admitting: Neurology

## 2017-10-27 ENCOUNTER — Ambulatory Visit: Payer: 59 | Admitting: Neurology

## 2017-10-27 VITALS — BP 138/92 | HR 73 | Ht 62.0 in | Wt 171.0 lb

## 2017-10-27 DIAGNOSIS — G43111 Migraine with aura, intractable, with status migrainosus: Secondary | ICD-10-CM | POA: Diagnosis not present

## 2017-10-27 MED ORDER — PROPRANOLOL HCL 20 MG PO TABS: 20.0000 mg | ORAL_TABLET | Freq: Two times a day (BID) | ORAL | 3 refills | Status: DC

## 2017-10-27 NOTE — Patient Instructions (Signed)
We will start propranolol for the headache.     Inderal (propranolol) is a blood pressure medication that is commonly used for migraine headaches. This is a type of beta blocker. The most common side effects include low heart rate, dizziness, fatigue, and increased depression. This medication may worsen asthma. If you believe that you are having side effects on this medication, please contact our office.  

## 2017-10-27 NOTE — Progress Notes (Signed)
Reason for visit: Migraine headache  Marvette Schamp is an 42 y.o. female  History of present illness:  Ms. Lien is a 42 year old right-handed white female with a history of intractable migraine headache.  The patient is averaging 4 headaches a month, but the headaches may last up to 2 days.  The patient takes Maxalt which either eliminates the headaches quickly or does not work at all.  The patient however is usually taking Advil first and then waiting to take the Maxalt.  The patient currently is on no preventative medications for her migraine.  She had been on Topamax but did not tolerate the medication secondary to cognitive side effects.  The patient has been on amitriptyline in the past and also had cognitive side effects on this.  She has been taking Zoloft on a regular basis for an anxiety disorder.  She has been on a blood pressure medication previously, she is not sure which medication she was on.  The patient returns to the office today for an evaluation.  In January, she had an event of almost daily headaches over a 2 or 3-week timeframe, the use of Depacon eventually broke the headache.  Past Medical History:  Diagnosis Date  . Anxiety 08/24/2015  . Calculus of kidney   . Classic migraine 03/28/2015  . Elevated blood pressure   . Family history of cerebral aneurysm 03/28/2015  . Headache   . Herpes     Past Surgical History:  Procedure Laterality Date  . ANTERIOR CRUCIATE LIGAMENT REPAIR    . CESAREAN SECTION    . CESAREAN SECTION  07/02/2012   Procedure: CESAREAN SECTION;  Surgeon: Robley Fries, MD;  Location: [facility];  Service: Obstetrics;  Laterality: N/A;  EDD: 07/27/12/Repeat  . LEEP      Family History  Problem Relation Age of Onset  . Aneurysm Maternal Aunt        cerebral  . Migraines Maternal Aunt   . Migraines Mother   . Migraines Sister   . Aneurysm Maternal Aunt   . Diabetes Paternal Uncle   . Diabetes Maternal Grandmother   . Melanoma Maternal  Grandmother   . Stroke Maternal Grandmother   . Prostate cancer Paternal Grandfather   . High blood pressure Paternal Grandmother     Social history:  reports that  has never smoked. she has never used smokeless tobacco. She reports that she drinks about 2.4 - 3.6 oz of alcohol per week. She reports that she does not use drugs.   No Known Allergies  Medications:  Prior to Admission medications   Medication Sig Start Date End Date Taking? Authorizing Provider  ALPRAZolam Prudy Feeler) 0.5 MG tablet Take 0.5 mg by mouth 3 (three) times daily as needed for anxiety.  03/22/15  Yes [provider]  buPROPion [facility]) 75 MG tablet  07/25/16  Yes [provider]  dexamethasone (DECADRON) 2 MG tablet Take 3 tablets the first day, 2 the second and 1 the third day 10/05/17  Yes York Spaniel, MD  ibuprofen (ADVIL,MOTRIN) 200 MG tablet Take 200 mg by mouth every 6 (six) hours as needed.   Yes [provider]  rizatriptan (MAXALT-MLT) 10 MG disintegrating tablet Take 1 tablet (10 mg total) by mouth 3 (three) times daily as needed for migraine. 10/05/17  Yes York Spaniel, MD  sertraline (ZOLOFT) 50 MG tablet Take 75 mg by mouth daily.  07/03/15  Yes [provider]  valACYclovir (VALTREX) 500 MG tablet Take  500 mg by mouth daily as needed. Pt takes only during an outbreak   Yes [provider]    ROS:  Out of a complete 14 system review of symptoms, the patient complains only of the following symptoms, and all other reviewed systems are negative.  Anxiety Fatigue  Blood pressure (!) 138/92, pulse 73, height 5\' 2"  (1.575 m), weight 171 lb (77.6 kg).  Physical Exam  General: The patient is alert and cooperative at the time of the examination.  Skin: No significant peripheral edema is noted.   Neurologic Exam  Mental status: The patient is alert and oriented x 3 at the time of the examination. The patient has apparent normal recent and remote  memory, with an apparently normal attention span and concentration ability.   Cranial nerves: Facial symmetry is present. Speech is normal, no aphasia or dysarthria is noted. Extraocular movements are full. Visual fields are full.  Motor: The patient has good strength in all 4 extremities.  Sensory examination: Soft touch sensation is symmetric on the face, arms, and legs.  Coordination: The patient has good finger-nose-finger and heel-to-shin bilaterally.  Gait and station: The patient has a normal gait. Tandem gait is normal. Romberg is negative. No drift is seen.  Reflexes: Deep tendon reflexes are symmetric.   Assessment/Plan:  1.  Migraine headache  The patient will be given a trial on propranolol in low-dose starting at 20 mg twice daily.  She will call for any dose adjustments.  If this is not effective or not well-tolerated, we may consider a medication such as Aimovig in the future.  The patient otherwise will follow-up in 4 months.  Marlan Palau. Keith Majd Tissue MD 10/27/2017 7:35 AM  [facility] [address] [place], [place] [id number]  Phone [phone] Fax [phone]

## 2017-12-21 ENCOUNTER — Telehealth: Payer: Self-pay | Admitting: Neurology

## 2017-12-21 MED ORDER — PROPRANOLOL HCL 20 MG PO TABS: 40.0000 mg | ORAL_TABLET | Freq: Two times a day (BID) | ORAL | 3 refills | Status: DC

## 2017-12-21 MED ORDER — SUMATRIPTAN SUCCINATE 6 MG/0.5ML ~~LOC~~ SOSY: 6.0000 mg | PREFILLED_SYRINGE | Freq: Two times a day (BID) | SUBCUTANEOUS | 3 refills | Status: DC | PRN

## 2017-12-21 NOTE — Telephone Encounter (Signed)
Pt requesting a call to discuss the option of stating an injection for her headaches stating she is unsure of the name(not a monthly injection)

## 2017-12-21 NOTE — Telephone Encounter (Signed)
I called the patient.  The patient is not tolerating Maxalt well due to tightness sensation in the jaw area.  The patient still has frequent headache, she is on propranolol taking 20 mg twice daily but is tolerating the drug well.  We will double the dose to 40 mg twice daily, she will be switched to the injectable Imitrex for her migraine.

## 2017-12-28 NOTE — Telephone Encounter (Signed)
Pt called stating that since doubling her dose for propranolol (INDERAL) 20 MG tablet last Monday 4/8 she has been "breaking out in sweat over nothing". Pt would like a call back to know if this is a normal s/e

## 2017-12-28 NOTE — Telephone Encounter (Signed)
I called the patient.  Patient is having episodes of sweating, she has not had any dizziness or fainting feelings, but this began when we went up to 40 mg twice daily of the propranolol.  The patient will convert back to 20 mg 3 times a day on the propranolol for 2 weeks, then go to 40 mg twice daily.  She will call if she is not tolerating the medication.  She is still having frequent almost daily headaches.

## 2018-02-24 ENCOUNTER — Ambulatory Visit (INDEPENDENT_AMBULATORY_CARE_PROVIDER_SITE_OTHER): Payer: 59 | Admitting: Adult Health

## 2018-02-24 ENCOUNTER — Encounter: Payer: Self-pay | Admitting: Adult Health

## 2018-02-24 VITALS — BP 119/81 | HR 69 | Ht 62.0 in | Wt 169.4 lb

## 2018-02-24 DIAGNOSIS — G43009 Migraine without aura, not intractable, without status migrainosus: Secondary | ICD-10-CM | POA: Diagnosis not present

## 2018-02-24 MED ORDER — ERENUMAB-AOOE 70 MG/ML ~~LOC~~ SOAJ: 70.0000 mg | SUBCUTANEOUS | 5 refills | Status: DC

## 2018-02-24 MED ORDER — NAPROXEN 500 MG PO TABS: 500.0000 mg | ORAL_TABLET | Freq: Two times a day (BID) | ORAL | 1 refills | Status: DC | PRN

## 2018-02-24 NOTE — Progress Notes (Signed)
PATIENT: Ashley Horne DOB: Dec 24, 1975  REASON FOR VISIT: follow up HISTORY FROM: patient  HISTORY OF PRESENT ILLNESS: Today 02/24/18 Ashley Horne is a 42 year old female with a history of intractable migraine headaches.  He returns today for follow-up.  She states that she stopped propranolol.  She did not find it beneficial for her headaches.  She states that she has approximately 2-3 headaches a week.  She has  1 migraine a week.  She states that the headache location varies.  She reports with her migraine she has photophobia and phonophobia but denies nausea and vomiting.  She states that she can use Imitrex injection and that has offered her some benefit.  She states that she is also using naproxen and that has helped as well.  She would like to consider starting aimovig.  She returns today for evaluation.  HISTORY Ashley Horne is a 42 year old right-handed white female with a history of intractable migraine headache.  The patient is averaging 4 headaches a month, but the headaches may last up to 2 days.  The patient takes Maxalt which either eliminates the headaches quickly or does not work at all.  The patient however is usually taking Advil first and then waiting to take the Maxalt.  The patient currently is on no preventative medications for her migraine.  She had been on Topamax but did not tolerate the medication secondary to cognitive side effects.  The patient has been on amitriptyline in the past and also had cognitive side effects on this.  She has been taking Zoloft on a regular basis for an anxiety disorder.  She has been on a blood pressure medication previously, she is not sure which medication she was on.  The patient returns to the office today for an evaluation.  In January, she had an event of almost daily headaches over a 2 or 3-week timeframe, the use of Depacon eventually broke the headache.    REVIEW OF SYSTEMS: Out of a complete 14 system review of symptoms, the patient  complains only of the following symptoms, and all other reviewed systems are negative.  See HPI  ALLERGIES: No Known Allergies  HOME MEDICATIONS: Outpatient Medications Prior to Visit  Medication Sig Dispense Refill  . ALPRAZolam (XANAX) 0.5 MG tablet Take 0.5 mg by mouth 3 (three) times daily as needed for anxiety.   0  . buPROPion (WELLBUTRIN) 75 MG tablet     . ibuprofen (ADVIL,MOTRIN) 200 MG tablet Take 200 mg by mouth every 6 (six) hours as needed.    . sertraline (ZOLOFT) 50 MG tablet Take 75 mg by mouth daily.     . SUMAtriptan (IMITREX) 6 MG/0.5ML SOSY injection Inject 0.5 mLs (6 mg total) into the skin 2 (two) times daily as needed for migraine or headache. 8 Syringe 3  . propranolol (INDERAL) 20 MG tablet Take 2 tablets (40 mg total) by mouth 2 (two) times daily. (Patient not taking: Reported on 02/24/2018) 120 tablet 3  . valACYclovir (VALTREX) 500 MG tablet Take 500 mg by mouth daily as needed. Pt takes only during an outbreak    . dexamethasone (DECADRON) 2 MG tablet Take 3 tablets the first day, 2 the second and 1 the third day (Patient not taking: Reported on 02/24/2018) 6 tablet 0   No facility-administered medications prior to visit.     PAST MEDICAL HISTORY: Past Medical History:  Diagnosis Date  . Anxiety 08/24/2015  . Calculus of kidney   . Classic migraine 03/28/2015  .  Elevated blood pressure   . Family history of cerebral aneurysm 03/28/2015  . Headache   . Herpes     PAST SURGICAL HISTORY: Past Surgical History:  Procedure Laterality Date  . ANTERIOR CRUCIATE LIGAMENT REPAIR    . CESAREAN SECTION    . CESAREAN SECTION  07/02/2012   Procedure: CESAREAN SECTION;  Surgeon: Robley Fries, MD;  Location: [facility];  Service: Obstetrics;  Laterality: N/A;  EDD: 07/27/12/Repeat  . LEEP      FAMILY HISTORY: Family History  Problem Relation Age of Onset  . Aneurysm Maternal Aunt        cerebral  . Migraines Maternal Aunt   . Migraines Mother   . Migraines  Sister   . Aneurysm Maternal Aunt   . Diabetes Paternal Uncle   . Diabetes Maternal Grandmother   . Melanoma Maternal Grandmother   . Stroke Maternal Grandmother   . Prostate cancer Paternal Grandfather   . High blood pressure Paternal Grandmother     SOCIAL HISTORY: Social History   Socioeconomic History  . Marital status: Married    Spouse name: Not on file  . Number of children: 1  . Years of education: BS  . Highest education level: Not on file  Occupational History  . Not on file  Social Needs  . Financial resource strain: Not on file  . Food insecurity:    Worry: Not on file    Inability: Not on file  . Transportation needs:    Medical: Not on file    Non-medical: Not on file  Tobacco Use  . Smoking status: Never Smoker  . Smokeless tobacco: Never Used  Substance and Sexual Activity  . Alcohol use: Yes    Alcohol/week: 2.4 - 3.6 oz    Types: 4 - 6 Glasses of wine per week  . Drug use: No  . Sexual activity: Not on file  Lifestyle  . Physical activity:    Days per week: Not on file    Minutes per session: Not on file  . Stress: Not on file  Relationships  . Social connections:    Talks on phone: Not on file    Gets together: Not on file    Attends religious service: Not on file    Active member of club or organization: Not on file    Attends meetings of clubs or organizations: Not on file    Relationship status: Not on file  . Intimate partner violence:    Fear of current or ex partner: Not on file    Emotionally abused: Not on file    Physically abused: Not on file    Forced sexual activity: Not on file  Other Topics Concern  . Not on file  Social History Narrative   Patient drinks 1-2 cups of caffeine daily.   Patient is right handed.      PHYSICAL EXAM  Vitals:   02/24/18 0902  BP: 119/81  Pulse: 69  Weight: 169 lb 6.4 oz (76.8 kg)  Height: 5\' 2"  (1.575 m)   Body mass index is 30.98 kg/m.  Generalized: Well developed, in no acute  distress   Neurological examination  Mentation: Alert oriented to time, place, history taking. Follows all commands speech and language fluent Cranial nerve II-XII: Pupils were equal round reactive to light. Extraocular movements were full, visual field were full on confrontational test. Facial sensation and strength were normal. Uvula tongue midline. Head turning and shoulder shrug  were normal and symmetric.  Motor: The motor testing reveals 5 over 5 strength of all 4 extremities. Good symmetric motor tone is noted throughout.  Sensory: Sensory testing is intact to soft touch on all 4 extremities. No evidence of extinction is noted.  Coordination: Cerebellar testing reveals good finger-nose-finger and heel-to-shin bilaterally.  Gait and station: Gait is normal. Tandem gait is normal. Romberg is negative. No drift is seen.  Reflexes: Deep tendon reflexes are symmetric and normal bilaterally.   DIAGNOSTIC DATA (LABS, IMAGING, TESTING) - I reviewed patient records, labs, notes, testing and imaging myself where available.  Lab Results  Component Value Date   WBC 5.9 04/23/2015   HGB 13.5 04/23/2015   HCT 39.9 04/23/2015   MCV 85.7 04/23/2015   PLT 310.0 04/23/2015      Component Value Date/Time   NA 140 06/22/2015 1414   K 3.7 06/22/2015 1414   CL 110 06/22/2015 1414   CO2 21 06/22/2015 1414   GLUCOSE 90 06/22/2015 1414   BUN 14 06/22/2015 1414   CREATININE 0.81 06/22/2015 1414   CALCIUM 8.9 06/22/2015 1414   PROT 7.0 03/12/2015 2200   ALBUMIN 4.4 03/12/2015 2200   AST 16 03/12/2015 2200   ALT 14 03/12/2015 2200   ALKPHOS 67 03/12/2015 2200   BILITOT 0.2 (L) 03/12/2015 2200   GFRNONAA >60 03/12/2015 2200   GFRAA >60 03/12/2015 2200    ASSESSMENT AND PLAN 42 y.o. year old female  has a past medical history of Anxiety (08/24/2015), Calculus of kidney, Classic migraine (03/28/2015), Elevated blood pressure, Family history of cerebral aneurysm (03/28/2015), Headache, and Herpes. here  with:  1.  Migraine headaches  The patient will continue to use Imitrex and naproxen.  I have provided her with a prescription for naproxen.  We will also start Aimovig 70 mg/281ml monthly injections.  She was given a copay savings card as well as information regarding Aimovig and potential side effects.  I have advised that if her symptoms worsen or she develops new symptoms she should let us know. She will follow-up in 6 months or sooner if needed.   I spent 15 minutes with the patient. 50% of this time was spent discussing Aimovig  Butch PennyMegan Rexann Lueras, MSN, NP-C 02/24/2018, 9:06 AM [facility] [address], [address] [place], [place] [address] [phone]

## 2018-02-24 NOTE — Progress Notes (Signed)
I have read the note, and I agree with the clinical assessment and plan.  Wilmot Quevedo K Kaylyn Garrow   

## 2018-02-24 NOTE — Patient Instructions (Signed)
Your Plan:  Continue imitrex Can use Naproxen 500 mg at the onset of migraine Aimovig ordered     Thank you for coming to see us at [facility]. I hope we have been able to provide you high quality care today.  You may receive a patient satisfaction survey over the next few weeks. We would appreciate your feedback and comments so that we may continue to improve ourselves and the health of our patients.

## 2018-03-13 ENCOUNTER — Other Ambulatory Visit: Payer: Self-pay | Admitting: Neurology

## 2018-03-20 ENCOUNTER — Other Ambulatory Visit: Payer: Self-pay | Admitting: Adult Health

## 2018-03-24 ENCOUNTER — Telehealth: Payer: Self-pay | Admitting: Adult Health

## 2018-03-24 NOTE — Telephone Encounter (Signed)
Patient is scheduled to take her second dose of Erenumab-aooe (AIMOVIG) 70 MG/ML SOAJ this Friday and has had a headache since this past Monday. Does she need to double up on the Aimovig or is the medicine just wearing off. Please call and discuss.

## 2018-03-24 NOTE — Telephone Encounter (Signed)
Called the patient and informed her that this is common around the time you are due for the medication. Informed her that since its only couple of days before next dose due it would be safe to go ahead and given the injection. I informed her that if the headaches are coming back strong and she still has several weeks to go then at that time it may be time to assess increasing the dose. Informed the patient to keep us posted if she has any issues. Pt was appreciative for the call.

## 2018-03-29 ENCOUNTER — Telehealth: Payer: Self-pay

## 2018-03-29 NOTE — Telephone Encounter (Signed)
We received a prior authorization request for the Aimovig 70mg /mL.  I have completed and submitted the PA on Cover My Meds. Cover My Meds Key: [id number]  I completed the PA and submitted, however, per insurance plan Ajovy and Emgality are preferred so I believe that we will be getting a denial on the Aimovig request.

## 2018-03-29 NOTE — Telephone Encounter (Signed)
If we get denial- can switch to Ajovy.

## 2018-03-29 NOTE — Telephone Encounter (Signed)
noted 

## 2018-03-30 MED ORDER — FREMANEZUMAB-VFRM 225 MG/1.5ML ~~LOC~~ SOSY: 225.0000 mg | PREFILLED_SYRINGE | SUBCUTANEOUS | 5 refills | Status: DC

## 2018-03-30 NOTE — Telephone Encounter (Signed)
Aimovig denied.  Per Megan's instructions below, new rx sent in for Ajovy.  I called and left patient detailed message on her voicemail notifying her of this therapy change (ok per DPR).

## 2018-03-30 NOTE — Addendum Note (Signed)
Addended by: Lindell SparKIRKMAN, Pallie Swigert C on: 03/30/2018 09:40 AM   Modules accepted: Orders

## 2018-06-04 ENCOUNTER — Telehealth: Payer: Self-pay | Admitting: Cardiology

## 2018-06-04 NOTE — Telephone Encounter (Signed)
Pt called to report that she has had 2 episodes of her heart pounding and chest tightness 2 nights in a row while laying in bed. She says it lasted for about 20 minutes each and went away on its own. She denies sob, dizziness, and any other symptoms related to it. I advised her that she needs to be seen since it has been almost 3 years. Pt agrees and will see Bhagat PA on Monday 06/07/18 at 2pm. If pain reoccurs or worsens I advised her that she can got to the ER over the weekend and she may also want to contact her PMD for an appt in case her symptoms are non cardiac. Pt verbalized understanding and agrees with plan.

## 2018-06-04 NOTE — Telephone Encounter (Signed)
New Message:     Pt c/o of Chest Pain: STAT if CP now or developed within 24 hours  1. Are you having CP right now? No ( Chest Tightness)   2. Are you experiencing any other symptoms (ex. SOB, nausea, vomiting, sweating)? SOB  3. How long have you been experiencing CP? 1 Day   4. Is your CP continuous or coming and going? Coming and Going   5. Have you taken Nitroglycerin? NO    Pt c/o Shortness Of Breath: STAT if SOB developed within the last 24 hours or pt is noticeably SOB on the phone  1. Are you currently SOB (can you hear that pt is SOB on the phone)? No   2. How long have you been experiencing SOB? 1 Day  3. Are you SOB when sitting or when up moving around? Laying Down   4. Are you currently experiencing any other symptoms?  BP 130/106    Pt c/o BP issue: STAT if pt c/o blurred vision, one-sided weakness or slurred speech  1. What are your last 5 BP readings?  130/106  2. Are you having any other symptoms (ex. Dizziness, headache, blurred vision, passed out)? Heahache  3. What is your BP issue? High BP  ?

## 2018-06-07 ENCOUNTER — Encounter: Payer: Self-pay | Admitting: *Deleted

## 2018-06-07 ENCOUNTER — Encounter: Payer: Self-pay | Admitting: Physician Assistant

## 2018-06-07 ENCOUNTER — Ambulatory Visit (INDEPENDENT_AMBULATORY_CARE_PROVIDER_SITE_OTHER): Payer: 59 | Admitting: Physician Assistant

## 2018-06-07 VITALS — BP 112/82 | HR 79 | Ht 62.0 in | Wt 161.2 lb

## 2018-06-07 DIAGNOSIS — R0789 Other chest pain: Secondary | ICD-10-CM

## 2018-06-07 DIAGNOSIS — R030 Elevated blood-pressure reading, without diagnosis of hypertension: Secondary | ICD-10-CM

## 2018-06-07 DIAGNOSIS — R002 Palpitations: Secondary | ICD-10-CM

## 2018-06-07 MED ORDER — PROPRANOLOL HCL 40 MG PO TABS: ORAL_TABLET | ORAL | Status: DC

## 2018-06-07 NOTE — Patient Instructions (Signed)
Medication Instructions:  1. Your physician recommends that you continue on your current medications as directed. Please refer to the Current Medication list given to you today.   Labwork: TODAY TSH  Testing/Procedures: 1. Your physician has recommended that you wear an event monitor. Event monitors are medical devices that record the heart's electrical activity. Doctors most often us these monitors to diagnose arrhythmias. Arrhythmias are problems with the speed or rhythm of the heartbeat. The monitor is a small, portable device. You can wear one while you do your normal daily activities. This is usually used to diagnose what is causing palpitations/syncope (passing out).  2. Your physician has requested that you have en exercise stress myoview. For further information please visit [email or web address]. Please follow instruction sheet, as given.    Follow-Up: 6-8 WEEKS WITH DR. Mayford KnifeURNER OR CARE TEAM   Any Other Special Instructions Will Be Listed Below (If Applicable).     If you need a refill on your cardiac medications before your next appointment, please call your pharmacy.

## 2018-06-07 NOTE — Progress Notes (Signed)
Cardiology Office Note    Date:  06/07/2018   ID:  Ashley Horne, DOB 18-Oct-1975, MRN [id number]  PCP:  Jarrett Soho, PA-C  Cardiologist:  Dr. Mayford Knife   Chief Complaint: Chest pain and palpitations   History of Present Illness:   Ashley Horne is a 42 y.o. female who follows Dr. Mayford Knife for high blood pressure added to my schedule for palpitations and chest pain.   Echo 04/2015 : LVEF 55-60%, normal wall thickness, normal wall motion, mild diastolic dysfunction, normal LV filling pressure, normal LA size.  Previously seen for HTN and possible FMD of renal arteries.  She was referred to Dr. Edilia Bo with [facility] who felt that she she did not have any evidence of FMD on CT scan.  Her PCP thought some of her BP issues may be due to anxiety and started her on Zoloft.  Her BP has become very well controlled with systolic in the 100-153mmHg range.   Coming today for chest pain and palpitations after about 3 years.  Last Thursday patient had an episode of palpitation while laying on back.  She was reading a book at that time.  Associated with chest heaviness and shortness of breath.  Symptoms lasted for about 20 minutes with self resolution. She had similar but less intense episode about a week ago.  Both time she also has numbness and tingling on her feet.  Lately she has noted intermittent chest "heaviness".  She goes to [facility] 2 times per week.  She does cardio and electric training.  Has noted intermittent palpitation and chest heaviness.  She denies orthopnea, PND, syncope, melena or blood in his stool or urine.  She is very anxious person at baseline.  Intermittently noted diastolic blood pressure in 90-100 during recently.  Past Medical History:  Diagnosis Date  . Anxiety 08/24/2015  . Calculus of kidney   . Classic migraine 03/28/2015  . Elevated blood pressure   . Family history of cerebral aneurysm 03/28/2015  . Headache   . Herpes     Past Surgical History:  Procedure Laterality Date    . ANTERIOR CRUCIATE LIGAMENT REPAIR    . CESAREAN SECTION    . CESAREAN SECTION  07/02/2012   Procedure: CESAREAN SECTION;  Surgeon: Robley Fries, MD;  Location: [facility];  Service: Obstetrics;  Laterality: N/A;  EDD: 07/27/12/Repeat  . LEEP      Current Medications: Prior to Admission medications   Medication Sig Start Date End Date Taking? Authorizing Provider  ALPRAZolam Prudy Feeler) 0.5 MG tablet Take 0.5 mg by mouth 3 (three) times daily as needed for anxiety.  03/22/15   [provider]  buPROPion (WELLBUTRIN) 75 MG tablet  07/25/16   [provider]  Erenumab-aooe (AIMOVIG) 70 MG/ML SOAJ Inject 70 mg into the skin every 30 (thirty) days. 02/24/18   Butch Penny, NP  Fremanezumab-vfrm (AJOVY) 225 MG/1.5ML SOSY Inject 225 mg into the skin every 30 (thirty) days. 03/30/18   Butch Penny, NP  ibuprofen (ADVIL,MOTRIN) 200 MG tablet Take 200 mg by mouth every 6 (six) hours as needed.    [provider]  naproxen (NAPROSYN) 500 MG tablet Take 1 tablet (500 mg total) by mouth every 12 (twelve) hours as needed for moderate pain. 02/24/18   Butch Penny, NP  propranolol (INDERAL) 20 MG tablet Take 2 tablets (40 mg total) by mouth 2 (two) times daily. Patient not taking: Reported on 02/24/2018 12/21/17   York Spaniel, MD  sertraline (ZOLOFT) 50 MG tablet  Take 75 mg by mouth daily.  07/03/15   [provider]  SUMAtriptan (IMITREX) 6 MG/0.5ML SOSY injection Inject 0.5 mLs (6 mg total) into the skin 2 (two) times daily as needed for migraine or headache. 12/21/17   York SpanielWillis, Charles K, MD  valACYclovir (VALTREX) 500 MG tablet Take 500 mg by mouth daily as needed. Pt takes only during an Producer, television/film/videooutbreak    [provider]    Allergies:   Patient has no known allergies.   Social History   Socioeconomic History  . Marital status: Married    Spouse name: Not on file  . Number of children: 1  . Years of education: BS  . Highest education level: Not on file   Occupational History  . Not on file  Social Needs  . Financial resource strain: Not on file  . Food insecurity:    Worry: Not on file    Inability: Not on file  . Transportation needs:    Medical: Not on file    Non-medical: Not on file  Tobacco Use  . Smoking status: Never Smoker  . Smokeless tobacco: Never Used  Substance and Sexual Activity  . Alcohol use: Yes    Alcohol/week: 4.0 - 6.0 standard drinks    Types: 4 - 6 Glasses of wine per week  . Drug use: No  . Sexual activity: Not on file  Lifestyle  . Physical activity:    Days per week: Not on file    Minutes per session: Not on file  . Stress: Not on file  Relationships  . Social connections:    Talks on phone: Not on file    Gets together: Not on file    Attends religious service: Not on file    Active member of club or organization: Not on file    Attends meetings of clubs or organizations: Not on file    Relationship status: Not on file  Other Topics Concern  . Not on file  Social History Narrative   Patient drinks 1-2 cups of caffeine daily.   Patient is right handed.     Family History:  The patient's family history includes Aneurysm in her maternal aunt and maternal aunt; Diabetes in her maternal grandmother and paternal uncle; High blood pressure in her paternal grandmother; Melanoma in her maternal grandmother; Migraines in her maternal aunt, mother, and sister; Prostate cancer in her paternal grandfather; Stroke in her maternal grandmother.   ROS:   Please see the history of present illness.    ROS All other systems reviewed and are negative.   PHYSICAL EXAM:   VS:  BP 112/82   Pulse 79   Ht 5\' 2"  (1.575 m)   Wt 161 lb 3.2 oz (73.1 kg)   SpO2 98%   BMI 29.48 kg/m    GEN: Well nourished, well developed, in no acute distress  HEENT: normal  Neck: no JVD, carotid bruits, or masses Cardiac: RRR; no murmurs, rubs, or gallops,no edema  Respiratory:  clear to auscultation bilaterally, normal work  of breathing GI: soft, nontender, nondistended, + BS MS: no deformity or atrophy  Skin: warm and dry, no rash Neuro:  Alert and Oriented x 3, Strength and sensation are intact Psych: euthymic mood, full affect  Wt Readings from Last 3 Encounters:  06/07/18 161 lb 3.2 oz (73.1 kg)  02/24/18 169 lb 6.4 oz (76.8 kg)  10/27/17 171 lb (77.6 kg)      Studies/Labs Reviewed:   EKG:  EKG  is ordered today.  The ekg ordered today demonstrates normal sinus rhythm at rate of 79 bpm Recent Labs: No results found for requested labs within last 8760 hours.   Lipid Panel No results found for: CHOL, TRIG, HDL, CHOLHDL, VLDL, LDLCALC, LDLDIRECT  Additional studies/ records that were reviewed today include:   As above  ASSESSMENT & PLAN:    1. Chest heaviness/palpitation -Her symptoms could be related to underlying history of anxiety/stress.  She has not took her propranolol for greater than 6 months.  She also has some exertional symptoms.   Discussed monitoring symptoms versus testing. Patient wants further investigation.  Will get 30-day event monitor and exercise Myoview.  Advised to use PRN propranolol (she has pills) if symptoms persisted greater than 30 minutes and heart rate above 120 bpm.  2.  Transient elevated blood pressure -Prior work-up negative.  It was resolved previously on anxiety medication.  Advised to keep log.    Medication Adjustments/Labs and Tests Ordered: Current medicines are reviewed at length with the patient today.  Concerns regarding medicines are outlined above.  Medication changes, Labs and Tests ordered today are listed in the Patient Instructions below. Patient Instructions  Medication Instructions:  1. Your physician recommends that you continue on your current medications as directed. Please refer to the Current Medication list given to you today.   Labwork: TODAY TSH  Testing/Procedures: 1. Your physician has recommended that you wear an event  monitor. Event monitors are medical devices that record the heart's electrical activity. Doctors most often [place] these monitors to diagnose arrhythmias. Arrhythmias are problems with the speed or rhythm of the heartbeat. The monitor is a small, portable device. You can wear one while you do your normal daily activities. This is usually used to diagnose what is causing palpitations/syncope (passing out).  2. Your physician has requested that you have en exercise stress myoview. For further information please visit [email or web address] Please follow instruction sheet, as given.    Follow-Up: 6-8 WEEKS WITH DR. Mayford Knife OR CARE TEAM   Any Other Special Instructions Will Be Listed Below (If Applicable).     If you need a refill on your cardiac medications before your next appointment, please call your pharmacy.      Lorelei Pont, [place]  06/07/2018 2:58 PM    [facility] [address] [place], [place], [place]  [address] Phone: [phone]; Fax: [phone]

## 2018-06-08 LAB — TSH: TSH: 2.16 u[IU]/mL (ref 0.450–4.500)

## 2018-06-08 NOTE — Progress Notes (Signed)
Pt has been made aware of normal result and verbalized understanding.  jw 06/08/18

## 2018-06-10 ENCOUNTER — Telehealth (HOSPITAL_COMMUNITY): Payer: Self-pay | Admitting: *Deleted

## 2018-06-10 NOTE — Telephone Encounter (Signed)
Patient given detailed instructions per Myocardial Perfusion Study Information Sheet for the test on 06/15/18 at 7:45. Patient notified to arrive 15 minutes early and that it is imperative to arrive on time for appointment to keep from having the test rescheduled.  If you need to cancel or reschedule your appointment, please call the office within 24 hours of your appointment. . Patient verbalized understanding.Ashley Horne

## 2018-06-15 ENCOUNTER — Ambulatory Visit (HOSPITAL_COMMUNITY): Payer: 59

## 2018-06-15 ENCOUNTER — Telehealth: Payer: Self-pay

## 2018-06-15 ENCOUNTER — Encounter (HOSPITAL_COMMUNITY): Payer: 59

## 2018-06-15 ENCOUNTER — Ambulatory Visit (HOSPITAL_COMMUNITY): Payer: 59 | Attending: Cardiology

## 2018-06-15 ENCOUNTER — Inpatient Hospital Stay (HOSPITAL_COMMUNITY): Admission: RE | Admit: 2018-06-15 | Payer: 59 | Source: Ambulatory Visit

## 2018-06-15 DIAGNOSIS — R0789 Other chest pain: Secondary | ICD-10-CM | POA: Diagnosis not present

## 2018-06-15 DIAGNOSIS — R0600 Dyspnea, unspecified: Secondary | ICD-10-CM | POA: Insufficient documentation

## 2018-06-15 DIAGNOSIS — I10 Essential (primary) hypertension: Secondary | ICD-10-CM | POA: Diagnosis not present

## 2018-06-15 DIAGNOSIS — R002 Palpitations: Secondary | ICD-10-CM | POA: Diagnosis not present

## 2018-06-15 NOTE — Telephone Encounter (Signed)
Per Dr. Mayford Knife, a stress echo order was placed. Sending message to be scheduled.

## 2018-06-15 NOTE — Telephone Encounter (Signed)
-----   Message from Quintella Reichert, MD sent at 06/15/2018  9:46 AM EDT ----- Regarding: RE: ok to change to gxt? Please put stress echo order in  ----- Message ----- From: Britt Bolognese Sent: 06/15/2018   8:45 AM EDT To: Quintella Reichert, MD Subject: RE: ok to change to gxt?                       Dr. Mayford Knife  Need order for stress echo.  Stress echo is approved.  There weren't any stress echo slots (at [facility]) but echo dept worked it out so pt could have her echo today at 1pm.  Her monitor is at 11:30.  Thanks  Charmaine  ----- Message ----- From: Quintella Reichert, MD Sent: 06/14/2018   8:05 PM EDT To: Britt Bolognese Subject: RE: ok to change to gxt?                       Change to stress echo  Traci TUrner ----- Message ----- From: Britt Bolognese Sent: 06/14/2018   4:57 PM EDT To: Quintella Reichert, MD, Manson Passey, PA Subject: ok to change to gxt?                           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  Very difficult to get a day off.  Case is in md review.  They most likely won't approve an exercise nuc.  They will prolly recommend a gxt.    Would it be ok to change to gxt?  Or wait to find out if nuc is approved.  Test is tomorrow morning.  I have her on the phone.

## 2018-06-17 ENCOUNTER — Ambulatory Visit (INDEPENDENT_AMBULATORY_CARE_PROVIDER_SITE_OTHER): Payer: 59

## 2018-06-17 DIAGNOSIS — R002 Palpitations: Secondary | ICD-10-CM

## 2018-07-27 ENCOUNTER — Ambulatory Visit: Payer: 59 | Admitting: Physician Assistant

## 2018-07-27 ENCOUNTER — Encounter: Payer: Self-pay | Admitting: Physician Assistant

## 2018-07-27 VITALS — BP 110/84 | HR 86 | Ht 62.0 in | Wt 171.1 lb

## 2018-07-27 DIAGNOSIS — R002 Palpitations: Secondary | ICD-10-CM | POA: Diagnosis not present

## 2018-07-27 DIAGNOSIS — R079 Chest pain, unspecified: Secondary | ICD-10-CM | POA: Diagnosis not present

## 2018-07-27 DIAGNOSIS — I10 Essential (primary) hypertension: Secondary | ICD-10-CM

## 2018-07-27 MED ORDER — SPIRONOLACTONE 50 MG PO TABS: 25.0000 mg | ORAL_TABLET | Freq: Every day | ORAL | 0 refills | Status: DC

## 2018-07-27 NOTE — Patient Instructions (Signed)
Medication Instructions:  Your physician has recommended you make the following change in your medication:   DECREASE: spironolactone to 25 mg once a day for 1 month. After 1 month if your Blood Pressure remains less than 135/85 then you may stop taking the spironolactone.    If you need a refill on your cardiac medications before your next appointment, please call your pharmacy.   Lab work: None Ordered  If you have labs (blood work) drawn today and your tests are completely normal, you will receive your results only by: Marland Kitchen MyChart Message (if you have MyChart) OR . A paper copy in the mail If you have any lab test that is abnormal or we need to change your treatment, we will call you to review the results.  Testing/Procedures: None ordered  Follow-Up: At [facility], you and your health needs are our priority.  As part of our continuing mission to provide you with exceptional heart care, we have created designated Provider Care Teams.  These Care Teams include your primary Cardiologist (physician) and Advanced Practice Providers (APPs -  Physician Assistants and Nurse Practitioners) who all work together to provide you with the care you need, when you need it. . You will need a follow up appointment in 1 year.  Please call our office 2 months in advance to schedule this appointment.  You may see Armanda Magic, MD or one of the following Advanced Practice Providers on your designated Care Team:   . Robbie Lis, PA-C . Dayna Dunn, PA-C . Jacolyn Reedy, PA-C  Any Other Special Instructions Will Be Listed Below (If Applicable). Two Gram Sodium Diet 2000 mg  What is Sodium? Sodium is a mineral found naturally in many foods. The most significant source of sodium in the diet is table salt, which is about 40% sodium.  Processed, convenience, and preserved foods also contain a large amount of sodium.  The body needs only 500 mg of sodium daily to function,  A normal diet provides more  than enough sodium even if you do not use salt.  Why Limit Sodium? A build up of sodium in the body can cause thirst, increased blood pressure, shortness of breath, and water retention.  Decreasing sodium in the diet can reduce edema and risk of heart attack or stroke associated with high blood pressure.  Keep in mind that there are many other factors involved in these health problems.  Heredity, obesity, lack of exercise, cigarette smoking, stress and what you eat all play a role.  General Guidelines:  Do not add salt at the table or in cooking.  One teaspoon of salt contains over 2 grams of sodium.  Read food labels  Avoid processed and convenience foods  Ask your dietitian before eating any foods not dicussed in the menu planning guidelines  Consult your physician if you wish to use a salt substitute or a sodium containing medication such as antacids.  Limit milk and milk products to 16 oz (2 cups) per day.  Shopping Hints:  READ LABELS!! "Dietetic" does not necessarily mean low sodium.  Salt and other sodium ingredients are often added to foods during processing.   Menu Planning Guidelines Food Group Choose More Often Avoid  Beverages (see also the milk group All fruit juices, low-sodium, salt-free vegetables juices, low-sodium carbonated beverages Regular vegetable or tomato juices, commercially softened water used for drinking or cooking  Breads and Cereals Enriched white, wheat, rye and pumpernickel bread, hard rolls and dinner rolls; muffins, cornbread  and waffles; most dry cereals, cooked cereal without added salt; unsalted crackers and breadsticks; low sodium or homemade bread crumbs Bread, rolls and crackers with salted tops; quick breads; instant hot cereals; pancakes; commercial bread stuffing; self-rising flower and biscuit mixes; regular bread crumbs or cracker crumbs  Desserts and Sweets Desserts and sweets mad with mild should be within allowance Instant pudding mixes and  cake mixes  Fats Butter or margarine; vegetable oils; unsalted salad dressings, regular salad dressings limited to 1 Tbs; light, sour and heavy cream Regular salad dressings containing bacon fat, bacon bits, and salt pork; snack dips made with instant soup mixes or processed cheese; salted nuts  Fruits Most fresh, frozen and canned fruits Fruits processed with salt or sodium-containing ingredient (some dried fruits are processed with sodium sulfites        Vegetables Fresh, frozen vegetables and low- sodium canned vegetables Regular canned vegetables, sauerkraut, pickled vegetables, and others prepared in brine; frozen vegetables in sauces; vegetables seasoned with ham, bacon or salt pork  Condiments, Sauces, Miscellaneous  Salt substitute with physician's approval; pepper, herbs, spices; vinegar, lemon or lime juice; hot pepper sauce; garlic powder, onion powder, low sodium soy sauce (1 Tbs.); low sodium condiments (ketchup, chili sauce, mustard) in limited amounts (1 tsp.) fresh ground horseradish; unsalted tortilla chips, pretzels, potato chips, popcorn, salsa (1/4 cup) Any seasoning made with salt including garlic salt, celery salt, onion salt, and seasoned salt; sea salt, rock salt, kosher salt; meat tenderizers; monosodium glutamate; mustard, regular soy sauce, barbecue, sauce, chili sauce, teriyaki sauce, steak sauce, Worcestershire sauce, and most flavored vinegars; canned gravy and mixes; regular condiments; salted snack foods, olives, picles, relish, horseradish sauce, catsup   Food preparation: Try these seasonings Meats:    Pork Sage, onion Serve with applesauce  Chicken Poultry seasoning, thyme, parsley Serve with cranberry sauce  Lamb Curry powder, rosemary, garlic, thyme Serve with mint sauce or jelly  Veal Marjoram, basil Serve with current jelly, cranberry sauce  Beef Pepper, bay leaf Serve with dry mustard, unsalted chive butter  Fish [place] leaf, dill Serve with unsalted lemon  butter, unsalted parsley butter  Vegetables:    Asparagus Lemon juice   Broccoli Lemon juice   Carrots Mustard dressing parsley, mint, nutmeg, glazed with unsalted butter and sugar   Green beans Marjoram, lemon juice, nutmeg,dill seed   Tomatoes Basil, marjoram, onion   Spice /blend for [employer]" 4 tsp ground thyme 1 tsp ground sage 3 tsp ground rosemary 4 tsp ground marjoram   Test your knowledge 1. A product that says "Salt Free" may still contain sodium. True or False 2. Garlic Powder and Hot Pepper Sauce an be used as alternative seasonings.True or False 3. Processed foods have more sodium than fresh foods.  True or False 4. Canned Vegetables have less sodium than froze True or False  WAYS TO DECREASE YOUR SODIUM INTAKE 1. Avoid the use of added salt in cooking and at the table.  Table salt (and other prepared seasonings which contain salt) is probably one of the greatest sources of sodium in the diet.  Unsalted foods can gain flavor from the sweet, sour, and butter taste sensations of herbs and spices.  Instead of using salt for seasoning, try the following seasonings with the foods listed.  Remember: how you use them to enhance natural food flavors is limited only by your creativity... Allspice-Meat, fish, eggs, fruit, peas, red and yellow vegetables Almond Extract-Fruit baked goods Anise Seed-Sweet breads, fruit, carrots, beets, cottage  cheese, cookies (tastes like licorice) Basil-Meat, fish, eggs, vegetables, rice, vegetables salads, soups, sauces Bay Leaf-Meat, fish, stews, poultry Burnet-Salad, vegetables (cucumber-like flavor) Caraway Seed-Bread, cookies, cottage cheese, meat, vegetables, cheese, rice Cardamon-Baked goods, fruit, soups Celery Powder or seed-Salads, salad dressings, sauces, meatloaf, soup, bread.Do not use  celery salt Chervil-Meats, salads, fish, eggs, vegetables, cottage cheese (parsley-like flavor) Chili Power-Meatloaf, chicken cheese, corn, eggplant,  egg dishes Chives-Salads cottage cheese, egg dishes, soups, vegetables, sauces Cilantro-Salsa, casseroles Cinnamon-Baked goods, fruit, pork, lamb, chicken, carrots Cloves-Fruit, baked goods, fish, pot roast, green beans, beets, carrots Coriander-Pastry, cookies, meat, salads, cheese (lemon-orange flavor) Cumin-Meatloaf, fish,cheese, eggs, cabbage,fruit pie (caraway flavor) [employer], fruit, eggs, fish, poultry, cottage cheese, vegetables Dill Seed-Meat, cottage cheese, poultry, vegetables, fish, salads, bread Fennel Seed-Bread, cookies, apples, pork, eggs, fish, beets, cabbage, cheese, Licorice-like flavor Garlic-(buds or powder) Salads, meat, poultry, fish, bread, butter, vegetables, potatoes.Do not  use garlic salt Ginger-Fruit, vegetables, baked goods, meat, fish, poultry Horseradish Root-Meet, vegetables, butter Lemon Juice or Extract-Vegetables, fruit, tea, baked goods, fish salads Mace-Baked goods fruit, vegetables, fish, poultry (taste like nutmeg) Maple Extract-Syrups Marjoram-Meat, chicken, fish, vegetables, breads, green salads (taste like Sage) Mint-Tea, lamb, sherbet, vegetables, desserts, carrots, cabbage Mustard, Dry or Seed-Cheese, eggs, meats, vegetables, poultry Nutmeg-Baked goods, fruit, chicken, eggs, vegetables, desserts Onion Powder-Meat, fish, poultry, vegetables, cheese, eggs, bread, rice salads (Do not use   Onion salt) Orange Extract-Desserts, baked goods Oregano-Pasta, eggs, cheese, onions, pork, lamb, fish, chicken, vegetables, green salads Paprika-Meat, fish, poultry, eggs, cheese, vegetables Parsley Flakes-Butter, vegetables, meat fish, poultry, eggs, bread, salads (certain forms may   Contain sodium Pepper-Meat fish, poultry, vegetables, eggs Peppermint Extract-Desserts, baked goods Poppy Seed-Eggs, bread, cheese, fruit dressings, baked goods, noodles, vegetables, cottage  [employer], poultry, meat, fish,  cauliflower, turnips,eggs bread Saffron-Rice, bread, veal, chicken, fish, eggs Sage-Meat, fish, poultry, onions, eggplant, tomateos, pork, stews Savory-Eggs, salads, poultry, meat, rice, vegetables, soups, pork Tarragon-Meat, poultry, fish, eggs, butter, vegetables (licorice-like flavor)  Thyme-Meat, poultry, fish, eggs, vegetables, (clover-like flavor), sauces, soups Tumeric-Salads, butter, eggs, fish, rice, vegetables (saffron-like flavor) Vanilla Extract-Baked goods, candy Vinegar-Salads, vegetables, meat marinades Walnut Extract-baked goods, candy  2. Choose your Foods Wisely   The following is a list of foods to avoid which are high in sodium:  Meats-Avoid all smoked, canned, salt cured, dried and kosher meat and fish as well as Anchovies   [employer] meats:Bologna, Liverwurst, Pastrami Canned meat or fish  Marinated herring Caviar    Pepperoni Corned Beef   Pizza Dried chipped beef  Salami Frozen breaded fish or meat Salt pork Frankfurters or hot dogs  Sardines Gefilte fish   Sausage Ham (boiled ham, Proscuitto Smoked butt    spiced ham)   Spam      TV Dinners Vegetables Canned vegetables (Regular) Relish Canned mushrooms  Sauerkraut Olives    Tomato juice Pickles  Bakery and Dessert Products Canned puddings  Cream pies Cheesecake   Decorated cakes Cookies  Beverages/Juices Tomato juice, regular  Gatorade   V-8 vegetable juice, regular  Breads and Cereals Biscuit mixes   Salted potato chips, corn chips, pretzels Bread stuffing mixes  Salted crackers and rolls Pancake and waffle mixes Self-rising flour  Seasonings Accent    Meat sauces Barbecue sauce  Meat tenderizer Catsup    Monosodium glutamate (MSG) Celery salt   Onion salt Chili sauce   Prepared mustard Garlic salt   Salt, seasoned salt, sea salt Gravy mixes   Soy sauce Horseradish  Steak sauce Ketchup   Tartar sauce Lite salt    Teriyaki sauce Marinade mixes   Worcestershire  sauce  Others Baking powder   Cocoa and cocoa mixes Baking soda   Commercial casserole mixes Candy-caramels, chocolate  Dehydrated soups    Bars, fudge,nougats  Instant rice and pasta mixes Canned broth or soup  Maraschino cherries Cheese, aged and processed cheese and cheese spreads  Learning Assessment Quiz  Indicated T (for True) or F (for False) for each of the following statements:  1. _____ Fresh fruits and vegetables and unprocessed grains are generally low in sodium 2. _____ Water may contain a considerable amount of sodium, depending on the source 3. _____ You can always tell if a food is high in sodium by tasting it 4. _____ Certain laxatives my be high in sodium and should be avoided unless prescribed   by a physician or pharmacist 5. _____ Salt substitutes may be used freely by anyone on a sodium restricted diet 6. _____ Sodium is present in table salt, food additives and as a natural component of   most foods 7. _____ Table salt is approximately 90% sodium 8. _____ Limiting sodium intake may help prevent excess fluid accumulation in the body 9. _____ On a sodium-restricted diet, seasonings such as bouillon soy sauce, and    cooking wine should be used in place of table salt 10. _____ On an ingredient list, a product which lists monosodium glutamate as the first   ingredient is an appropriate food to include on a low sodium diet  Circle the best answer(s) to the following statements (Hint: there may be more than one correct answer)  11. On a low-sodium diet, some acceptable snack items are:    A. Olives  F. Bean dip   K. Grapefruit juice    B. Salted Pretzels G. Commercial Popcorn   L. Canned peaches    C. Carrot Sticks  H. Bouillon   M. Unsalted nuts   D. [place] fries  I. Peanut butter crackers N. Salami   E. Sweet pickles J. Tomato Juice   O. Pizza  12.  Seasonings that may be used freely on a reduced - sodium diet include   A. Lemon wedges F.Monosodium  glutamate K. Celery seed    B.Soysauce   G. Pepper   L. Mustard powder   C. Sea salt  H. Cooking wine  M. Onion flakes   D. Vinegar  E. Prepared horseradish N. Salsa   E. Sage   J. Worcestershire sauce  O. Chutney

## 2018-07-27 NOTE — Progress Notes (Signed)
Cardiology Office Note    Date:  07/27/2018   ID:  Ashley Horne, DOB 1976-07-02, MRN [id number]  PCP:  Jarrett Soho, PA-C  Cardiologist: Armanda Magic, MD EPS: None  No chief complaint on file.   History of Present Illness:  Ashley Horne is a 42 y.o. female with history of hypertension, possible FMD of renal arteries.  Dr. Edilia Bo with [facility] did not feel she had evidence of FMD on CT scan.  PCP started her on Zoloft for anxiety that could be causing some of her blood pressure issues.  Patient saw a provider in our office 06/07/2018 with chest pain and palpitations.  Stress echo was ordered and was normal.  Monitor showed normal sinus rhythm and sinus tachycardia with heart rate ranging from 75 to 154 bpm.  No arrhythmias.  She takes propanolol as needed.  Patient comes in today for follow-up All test are reviewed with her.  She thinks all her symptoms are from stress and anxiety.  She wants to come off Spironolactone which her gynecologist gave her for hypertension.  Blood pressure is normal today.  She does not think she needs anything for blood pressure is trying to get off medications and healthy.  Past Medical History:  Diagnosis Date  . Anxiety 08/24/2015  . Calculus of kidney   . Classic migraine 03/28/2015  . Elevated blood pressure   . Family history of cerebral aneurysm 03/28/2015  . Headache   . Herpes     Past Surgical History:  Procedure Laterality Date  . ANTERIOR CRUCIATE LIGAMENT REPAIR    . CESAREAN SECTION    . CESAREAN SECTION  07/02/2012   Procedure: CESAREAN SECTION;  Surgeon: Robley Fries, MD;  Location: [facility];  Service: Obstetrics;  Laterality: N/A;  EDD: 07/27/12/Repeat  . LEEP      Current Medications: Current Meds  Medication Sig  . ALPRAZolam (XANAX) 0.5 MG tablet Take 0.5 mg by mouth 3 (three) times daily as needed for anxiety.   Marland Kitchen buPROPion (WELLBUTRIN XL) 150 MG 24 hr tablet Take 150 mg by mouth daily.  Marland Kitchen FLUoxetine (PROZAC) 20 MG  capsule Take 20 mg by mouth daily.  . Fremanezumab-vfrm (AJOVY) 225 MG/1.5ML SOSY Inject 225 mg into the skin every 30 (thirty) days.  Marland Kitchen ibuprofen (ADVIL,MOTRIN) 200 MG tablet Take 200 mg by mouth every 6 (six) hours as needed.  Marland Kitchen spironolactone (ALDACTONE) 50 MG tablet Take 0.5 tablets (25 mg total) by mouth daily.  . SUMAtriptan (IMITREX) 6 MG/0.5ML SOSY injection Inject 0.5 mLs (6 mg total) into the skin 2 (two) times daily as needed for migraine or headache.  . valACYclovir (VALTREX) 500 MG tablet Take 500 mg by mouth daily as needed. Pt takes only during an outbreak  . [DISCONTINUED] spironolactone (ALDACTONE) 50 MG tablet Take 1 tablet by mouth daily.     Allergies:   Patient has no known allergies.   Social History   Socioeconomic History  . Marital status: Married    Spouse name: Not on file  . Number of children: 1  . Years of education: BS  . Highest education level: Not on file  Occupational History  . Not on file  Social Needs  . Financial resource strain: Not on file  . Food insecurity:    Worry: Not on file    Inability: Not on file  . Transportation needs:    Medical: Not on file    Non-medical: Not on file  Tobacco Use  . Smoking status:  Never Smoker  . Smokeless tobacco: Never Used  Substance and Sexual Activity  . Alcohol use: Yes    Alcohol/week: 4.0 - 6.0 standard drinks    Types: 4 - 6 Glasses of wine per week  . Drug use: No  . Sexual activity: Not on file  Lifestyle  . Physical activity:    Days per week: Not on file    Minutes per session: Not on file  . Stress: Not on file  Relationships  . Social connections:    Talks on phone: Not on file    Gets together: Not on file    Attends religious service: Not on file    Active member of club or organization: Not on file    Attends meetings of clubs or organizations: Not on file    Relationship status: Not on file  Other Topics Concern  . Not on file  Social History Narrative   Patient drinks  1-2 cups of caffeine daily.   Patient is right handed.     Family History:  The patient's   family history includes Aneurysm in her maternal aunt and maternal aunt; Diabetes in her maternal grandmother and paternal uncle; High blood pressure in her paternal grandmother; Melanoma in her maternal grandmother; Migraines in her maternal aunt, mother, and sister; Prostate cancer in her paternal grandfather; Stroke in her maternal grandmother.   ROS:   Please see the history of present illness.    Review of Systems  Constitution: Negative.  HENT: Negative.   Eyes: Negative.   Cardiovascular: Negative.   Respiratory: Negative.   Hematologic/Lymphatic: Negative.   Musculoskeletal: Negative.  Negative for joint pain.  Gastrointestinal: Negative.   Genitourinary: Negative.   Neurological: Positive for headaches.  Psychiatric/Behavioral: The patient is nervous/anxious.    All other systems reviewed and are negative.   PHYSICAL EXAM:   VS:  BP 110/84   Pulse 86   Ht 5\' 2"  (1.575 m)   Wt 171 lb 1.9 oz (77.6 kg)   SpO2 98%   BMI 31.30 kg/m   Physical Exam  GEN: Well nourished, well developed, in no acute distress  Neck: no JVD, carotid bruits, or masses Cardiac:RRR; no murmurs, rubs, or gallops  Respiratory:  clear to auscultation bilaterally, normal work of breathing GI: soft, nontender, nondistended, + BS Ext: without cyanosis, clubbing, or edema, Good distal pulses bilaterally Neuro:  Alert and Oriented x 3 Psych: euthymic mood, full affect  Wt Readings from Last 3 Encounters:  07/27/18 171 lb 1.9 oz (77.6 kg)  06/07/18 161 lb 3.2 oz (73.1 kg)  02/24/18 169 lb 6.4 oz (76.8 kg)      Studies/Labs Reviewed:   EKG:  EKG is not ordered today.    Recent Labs: 06/07/2018: TSH 2.160   Lipid Panel No results found for: CHOL, TRIG, HDL, CHOLHDL, VLDL, LDLCALC, LDLDIRECT  Additional studies/ records that were reviewed today include:  Holter monitor 06/17/2018  Normal sinus rhythm  and sinus tachycardia with heart rate ranging from 75 to 154 bpm   Stress echo 06/15/2018  stress ECG conclusions: There were no stress arrhythmias or   conduction abnormalities. The stress ECG was normal. - Staged echo: There was no echocardiographic evidence for   stress-induced ischemia.   Impressions:   - Low risk stress ECHO, normal   ASSESSMENT:    1. Benign essential HTN   2. Chest pain, unspecified type   3. Palpitations      PLAN:  In order of problems listed  above:  Essential hypertension patient's blood pressure has been normal but she is on Spironolactone 50 mg daily.  She really wants to come off this medication.  I told her she can reduce it to 25 mg once a day and monitor her blood pressures daily.  If it stays low she can try stopping it but still needs to monitor her blood pressures on a daily basis.  She says she is committed to getting healthy and starting an exercise program.  2 g sodium diet discussed and given.  She will call us with blood pressure readings otherwise follow-up with Dr. Mayford Knife in 1 year.  History of chest pain normal stress echo 06/15/2018  Palpitations with normal sinus rhythm to sinus tachycardia on recent monitor takes as needed propanolol.    Medication Adjustments/Labs and Tests Ordered: Current medicines are reviewed at length with the patient today.  Concerns regarding medicines are outlined above.  Medication changes, Labs and Tests ordered today are listed in the Patient Instructions below. Patient Instructions   Medication Instructions:  Your physician has recommended you make the following change in your medication:   DECREASE: spironolactone to 25 mg once a day for 1 month. After 1 month if your Blood Pressure remains less than 135/85 then you may stop taking the spironolactone.    If you need a refill on your cardiac medications before your next appointment, please call your pharmacy.   Lab work: None Ordered  If you have  labs (blood work) drawn today and your tests are completely normal, you will receive your results only by: Marland Kitchen MyChart Message (if you have MyChart) OR . A paper copy in the mail If you have any lab test that is abnormal or we need to change your treatment, we will call you to review the results.  Testing/Procedures: None ordered  Follow-Up: At [facility], you and your health needs are our priority.  As part of our continuing mission to provide you with exceptional heart care, we have created designated Provider Care Teams.  These Care Teams include your primary Cardiologist (physician) and Advanced Practice Providers (APPs -  Physician Assistants and Nurse Practitioners) who all work together to provide you with the care you need, when you need it. . You will need a follow up appointment in 1 year.  Please call our office 2 months in advance to schedule this appointment.  You may see Armanda Magic, MD or one of the following Advanced Practice Providers on your designated Care Team:   . Robbie Lis, PA-C . Dayna Dunn, PA-C . Jacolyn Reedy, PA-C  Any Other Special Instructions Will Be Listed Below (If Applicable). Two Gram Sodium Diet 2000 mg  What is Sodium? Sodium is a mineral found naturally in many foods. The most significant source of sodium in the diet is table salt, which is about 40% sodium.  Processed, convenience, and preserved foods also contain a large amount of sodium.  The body needs only 500 mg of sodium daily to function,  A normal diet provides more than enough sodium even if you do not use salt.  Why Limit Sodium? A build up of sodium in the body can cause thirst, increased blood pressure, shortness of breath, and water retention.  Decreasing sodium in the diet can reduce edema and risk of heart attack or stroke associated with high blood pressure.  Keep in mind that there are many other factors involved in these health problems.  Heredity, obesity, lack of exercise,  cigarette  smoking, stress and what you eat all play a role.  General Guidelines:  Do not add salt at the table or in cooking.  One teaspoon of salt contains over 2 grams of sodium.  Read food labels  Avoid processed and convenience foods  Ask your dietitian before eating any foods not dicussed in the menu planning guidelines  Consult your physician if you wish to use a salt substitute or a sodium containing medication such as antacids.  Limit milk and milk products to 16 oz (2 cups) per day.  Shopping Hints:  READ LABELS!! "Dietetic" does not necessarily mean low sodium.  Salt and other sodium ingredients are often added to foods during processing.   Menu Planning Guidelines Food Group Choose More Often Avoid  Beverages (see also the milk group All fruit juices, low-sodium, salt-free vegetables juices, low-sodium carbonated beverages Regular vegetable or tomato juices, commercially softened water used for drinking or cooking  Breads and Cereals Enriched white, wheat, rye and pumpernickel bread, hard rolls and dinner rolls; muffins, cornbread and waffles; most dry cereals, cooked cereal without added salt; unsalted crackers and breadsticks; low sodium or homemade bread crumbs Bread, rolls and crackers with salted tops; quick breads; instant hot cereals; pancakes; commercial bread stuffing; self-rising flower and biscuit mixes; regular bread crumbs or cracker crumbs  Desserts and Sweets Desserts and sweets mad with mild should be within allowance Instant pudding mixes and cake mixes  Fats Butter or margarine; vegetable oils; unsalted salad dressings, regular salad dressings limited to 1 Tbs; light, sour and heavy cream Regular salad dressings containing bacon fat, bacon bits, and salt pork; snack dips made with instant soup mixes or processed cheese; salted nuts  Fruits Most fresh, frozen and canned fruits Fruits processed with salt or sodium-containing ingredient (some dried fruits are  processed with sodium sulfites        Vegetables Fresh, frozen vegetables and low- sodium canned vegetables Regular canned vegetables, sauerkraut, pickled vegetables, and others prepared in brine; frozen vegetables in sauces; vegetables seasoned with ham, bacon or salt pork  Condiments, Sauces, Miscellaneous  Salt substitute with physician's approval; pepper, herbs, spices; vinegar, lemon or lime juice; hot pepper sauce; garlic powder, onion powder, low sodium soy sauce (1 Tbs.); low sodium condiments (ketchup, chili sauce, mustard) in limited amounts (1 tsp.) fresh ground horseradish; unsalted tortilla chips, pretzels, potato chips, popcorn, salsa (1/4 cup) Any seasoning made with salt including garlic salt, celery salt, onion salt, and seasoned salt; sea salt, rock salt, kosher salt; meat tenderizers; monosodium glutamate; mustard, regular soy sauce, barbecue, sauce, chili sauce, teriyaki sauce, steak sauce, Worcestershire sauce, and most flavored vinegars; canned gravy and mixes; regular condiments; salted snack foods, olives, picles, relish, horseradish sauce, catsup   Food preparation: Try these seasonings Meats:    Pork Sage, onion Serve with applesauce  Chicken Poultry seasoning, thyme, parsley Serve with cranberry sauce  Lamb Curry powder, rosemary, garlic, thyme Serve with mint sauce or jelly  Veal Marjoram, basil Serve with current jelly, cranberry sauce  Beef Pepper, bay leaf Serve with dry mustard, unsalted chive butter  Fish [place] leaf, dill Serve with unsalted lemon butter, unsalted parsley butter  Vegetables:    Asparagus Lemon juice   Broccoli Lemon juice   Carrots Mustard dressing parsley, mint, nutmeg, glazed with unsalted butter and sugar   Green beans Marjoram, lemon juice, nutmeg,dill seed   Tomatoes Basil, marjoram, onion   Spice /blend for [employer]" 4 tsp ground thyme 1 tsp ground sage 3  tsp ground rosemary 4 tsp ground marjoram   Test your knowledge 1. A  product that says "Salt Free" may still contain sodium. True or False 2. Garlic Powder and Hot Pepper Sauce an be used as alternative seasonings.True or False 3. Processed foods have more sodium than fresh foods.  True or False 4. Canned Vegetables have less sodium than froze True or False  WAYS TO DECREASE YOUR SODIUM INTAKE 1. Avoid the use of added salt in cooking and at the table.  Table salt (and other prepared seasonings which contain salt) is probably one of the greatest sources of sodium in the diet.  Unsalted foods can gain flavor from the sweet, sour, and butter taste sensations of herbs and spices.  Instead of using salt for seasoning, try the following seasonings with the foods listed.  Remember: how you use them to enhance natural food flavors is limited only by your creativity... Allspice-Meat, fish, eggs, fruit, peas, red and yellow vegetables Almond Extract-Fruit baked goods Anise Seed-Sweet breads, fruit, carrots, beets, cottage cheese, cookies (tastes like licorice) Basil-Meat, fish, eggs, vegetables, rice, vegetables salads, soups, sauces Bay Leaf-Meat, fish, stews, poultry Burnet-Salad, vegetables (cucumber-like flavor) Caraway Seed-Bread, cookies, cottage cheese, meat, vegetables, cheese, rice Cardamon-Baked goods, fruit, soups Celery Powder or seed-Salads, salad dressings, sauces, meatloaf, soup, bread.Do not use  celery salt Chervil-Meats, salads, fish, eggs, vegetables, cottage cheese (parsley-like flavor) Chili Power-Meatloaf, chicken cheese, corn, eggplant, egg dishes Chives-Salads cottage cheese, egg dishes, soups, vegetables, sauces Cilantro-Salsa, casseroles Cinnamon-Baked goods, fruit, pork, lamb, chicken, carrots Cloves-Fruit, baked goods, fish, pot roast, green beans, beets, carrots Coriander-Pastry, cookies, meat, salads, cheese (lemon-orange flavor) Cumin-Meatloaf, fish,cheese, eggs, cabbage,fruit pie (caraway flavor) [employer], fruit, eggs, fish,  poultry, cottage cheese, vegetables Dill Seed-Meat, cottage cheese, poultry, vegetables, fish, salads, bread Fennel Seed-Bread, cookies, apples, pork, eggs, fish, beets, cabbage, cheese, Licorice-like flavor Garlic-(buds or powder) Salads, meat, poultry, fish, bread, butter, vegetables, potatoes.Do not  use garlic salt Ginger-Fruit, vegetables, baked goods, meat, fish, poultry Horseradish Root-Meet, vegetables, butter Lemon Juice or Extract-Vegetables, fruit, tea, baked goods, fish salads Mace-Baked goods fruit, vegetables, fish, poultry (taste like nutmeg) Maple Extract-Syrups Marjoram-Meat, chicken, fish, vegetables, breads, green salads (taste like Sage) Mint-Tea, lamb, sherbet, vegetables, desserts, carrots, cabbage Mustard, Dry or Seed-Cheese, eggs, meats, vegetables, poultry Nutmeg-Baked goods, fruit, chicken, eggs, vegetables, desserts Onion Powder-Meat, fish, poultry, vegetables, cheese, eggs, bread, rice salads (Do not use   Onion salt) Orange Extract-Desserts, baked goods Oregano-Pasta, eggs, cheese, onions, pork, lamb, fish, chicken, vegetables, green salads Paprika-Meat, fish, poultry, eggs, cheese, vegetables Parsley Flakes-Butter, vegetables, meat fish, poultry, eggs, bread, salads (certain forms may   Contain sodium Pepper-Meat fish, poultry, vegetables, eggs Peppermint Extract-Desserts, baked goods Poppy Seed-Eggs, bread, cheese, fruit dressings, baked goods, noodles, vegetables, cottage  [employer], poultry, meat, fish, cauliflower, turnips,eggs bread Saffron-Rice, bread, veal, chicken, fish, eggs Sage-Meat, fish, poultry, onions, eggplant, tomateos, pork, stews Savory-Eggs, salads, poultry, meat, rice, vegetables, soups, pork Tarragon-Meat, poultry, fish, eggs, butter, vegetables (licorice-like flavor)  Thyme-Meat, poultry, fish, eggs, vegetables, (clover-like flavor), sauces, soups Tumeric-Salads, butter, eggs, fish, rice,  vegetables (saffron-like flavor) Vanilla Extract-Baked goods, candy Vinegar-Salads, vegetables, meat marinades Walnut Extract-baked goods, candy  2. Choose your Foods Wisely   The following is a list of foods to avoid which are high in sodium:  Meats-Avoid all smoked, canned, salt cured, dried and kosher meat and fish as well as Anchovies   [employer] meats:Bologna, Liverwurst, Pastrami Canned meat or fish  Marinated  herring Caviar    Pepperoni Corned Beef   Pizza Dried chipped beef  Salami Frozen breaded fish or meat Salt pork Frankfurters or hot dogs  Sardines Gefilte fish   Sausage Ham (boiled ham, Proscuitto Smoked butt    spiced ham)   Spam      TV Dinners Vegetables Canned vegetables (Regular) Relish Canned mushrooms  Sauerkraut Olives    Tomato juice Pickles  Bakery and Dessert Products Canned puddings  Cream pies Cheesecake   Decorated cakes Cookies  Beverages/Juices Tomato juice, regular  Gatorade   V-8 vegetable juice, regular  Breads and Cereals Biscuit mixes   Salted potato chips, corn chips, pretzels Bread stuffing mixes  Salted crackers and rolls Pancake and waffle mixes Self-rising flour  Seasonings Accent    Meat sauces Barbecue sauce  Meat tenderizer Catsup    Monosodium glutamate (MSG) Celery salt   Onion salt Chili sauce   Prepared mustard Garlic salt   Salt, seasoned salt, sea salt Gravy mixes   Soy sauce Horseradish   Steak sauce Ketchup   Tartar sauce Lite salt    Teriyaki sauce Marinade mixes   Worcestershire sauce  Others Baking powder   Cocoa and cocoa mixes Baking soda   Commercial casserole mixes Candy-caramels, chocolate  Dehydrated soups    Bars, fudge,nougats  Instant rice and pasta mixes Canned broth or soup  Maraschino cherries Cheese, aged and processed cheese and cheese spreads  Learning Assessment Quiz  Indicated T (for True) or F (for False) for each of the following statements:  1. _____ Fresh fruits and  vegetables and unprocessed grains are generally low in sodium 2. _____ Water may contain a considerable amount of sodium, depending on the source 3. _____ You can always tell if a food is high in sodium by tasting it 4. _____ Certain laxatives my be high in sodium and should be avoided unless prescribed   by a physician or pharmacist 5. _____ Salt substitutes may be used freely by anyone on a sodium restricted diet 6. _____ Sodium is present in table salt, food additives and as a natural component of   most foods 7. _____ Table salt is approximately 90% sodium 8. _____ Limiting sodium intake may help prevent excess fluid accumulation in the body 9. _____ On a sodium-restricted diet, seasonings such as bouillon soy sauce, and    cooking wine should be used in place of table salt 10. _____ On an ingredient list, a product which lists monosodium glutamate as the first   ingredient is an appropriate food to include on a low sodium diet  Circle the best answer(s) to the following statements (Hint: there may be more than one correct answer)  11. On a low-sodium diet, some acceptable snack items are:    A. Olives  F. Bean dip   K. Grapefruit juice    B. Salted Pretzels G. Commercial Popcorn   L. Canned peaches    C. Carrot Sticks  H. Bouillon   M. Unsalted nuts   D. [place] fries  I. Peanut butter crackers N. Salami   E. Sweet pickles J. Tomato Juice   O. Pizza  12.  Seasonings that may be used freely on a reduced - sodium diet include   A. Lemon wedges F.Monosodium glutamate K. Celery seed    B.Soysauce   G. Pepper   L. Mustard powder   C. Sea salt  H. Cooking wine  M. Onion flakes   D. Vinegar  E. Prepared horseradish  N. Salsa   E. Sage   J. Worcestershire sauce  O. Chutney       Signed, Jacolyn Reedy, PA-C  07/27/2018 2:15 PM    [facility] [address] [place], [place], [place]  [address] Phone: [phone]; Fax: [phone]

## 2018-08-25 DIAGNOSIS — Z6831 Body mass index (BMI) 31.0-31.9, adult: Secondary | ICD-10-CM | POA: Diagnosis not present

## 2018-08-25 DIAGNOSIS — Z01419 Encounter for gynecological examination (general) (routine) without abnormal findings: Secondary | ICD-10-CM | POA: Diagnosis not present

## 2018-08-31 ENCOUNTER — Ambulatory Visit (INDEPENDENT_AMBULATORY_CARE_PROVIDER_SITE_OTHER): Payer: 59 | Admitting: Adult Health

## 2018-08-31 ENCOUNTER — Encounter: Payer: Self-pay | Admitting: Adult Health

## 2018-08-31 VITALS — BP 110/79 | HR 80 | Ht 62.0 in | Wt 167.0 lb

## 2018-08-31 DIAGNOSIS — G43009 Migraine without aura, not intractable, without status migrainosus: Secondary | ICD-10-CM | POA: Diagnosis not present

## 2018-08-31 MED ORDER — SUMATRIPTAN SUCCINATE 6 MG/0.5ML ~~LOC~~ SOSY: PREFILLED_SYRINGE | SUBCUTANEOUS | 3 refills | Status: DC

## 2018-08-31 MED ORDER — SUMATRIPTAN SUCCINATE 100 MG PO TABS: ORAL_TABLET | ORAL | 2 refills | Status: DC

## 2018-08-31 MED ORDER — FREMANEZUMAB-VFRM 225 MG/1.5ML ~~LOC~~ SOSY: 225.0000 mg | PREFILLED_SYRINGE | SUBCUTANEOUS | 11 refills | Status: DC

## 2018-08-31 NOTE — Progress Notes (Signed)
PATIENT: Ashley Horne DOB: 1976/01/10  REASON FOR VISIT: follow up HISTORY FROM: patient  HISTORY OF PRESENT ILLNESS: Today 08/31/18:  Ashley Horne is a 42 year old female with a history of intractable migraine headaches.  She returns today for follow-up.  She is currently on Ajovy and tolerating it well.  She states that this has worked well for her headaches.  She states that she typically does not have any headaches until approximately 1 week before her next injection is due.  She states that these headaches rarely turn into migraines.  She continues to use Imitrex injection when she has a migraine.  She reports that her headaches typically resolve and half an hour.  She would like oral Imitrex to take at work.  She states that she cannot tolerate taking the injection while she is working.  She returns today for evaluation.   HISTORY 02/24/18 Ashley Horne is a 42 year old female with a history of intractable migraine headaches.  He returns today for follow-up.  She states that she stopped propranolol.  She did not find it beneficial for her headaches.  She states that she has approximately 2-3 headaches a week.  She has  1 migraine a week.  She states that the headache location varies.  She reports with her migraine she has photophobia and phonophobia but denies nausea and vomiting.  She states that she can use Imitrex injection and that has offered her some benefit.  She states that she is also using naproxen and that has helped as well.  She would like to consider starting aimovig.  She returns today for evaluation.  REVIEW OF SYSTEMS: Out of a complete 14 system review of symptoms, the patient complains only of the following symptoms, and all other reviewed systems are negative.  See HPI  ALLERGIES: No Known Allergies  HOME MEDICATIONS: Outpatient Medications Prior to Visit  Medication Sig Dispense Refill  . ALPRAZolam (XANAX) 0.5 MG tablet Take 0.5 mg by mouth 3 (three) times  daily as needed for anxiety.   0  . buPROPion (WELLBUTRIN XL) 150 MG 24 hr tablet Take 150 mg by mouth daily.    Marland Kitchen FLUoxetine (PROZAC) 20 MG capsule Take 20 mg by mouth daily.    . Fremanezumab-vfrm (AJOVY) 225 MG/1.5ML SOSY Inject 225 mg into the skin every 30 (thirty) days. 1 Syringe 5  . ibuprofen (ADVIL,MOTRIN) 200 MG tablet Take 200 mg by mouth every 6 (six) hours as needed.    Marland Kitchen LABETALOL HCL PO Take 1 tablet by mouth 2 (two) times daily.    Marland Kitchen spironolactone (ALDACTONE) 25 MG tablet Take 25 mg by mouth daily.    . SUMAtriptan (IMITREX) 6 MG/0.5ML SOSY injection Inject 0.5 mLs (6 mg total) into the skin 2 (two) times daily as needed for migraine or headache. 8 Syringe 3  . valACYclovir (VALTREX) 500 MG tablet Take 500 mg by mouth daily as needed. Pt takes only during an outbreak    . spironolactone (ALDACTONE) 50 MG tablet Take 0.5 tablets (25 mg total) by mouth daily. 15 tablet 0   No facility-administered medications prior to visit.     PAST MEDICAL HISTORY: Past Medical History:  Diagnosis Date  . Anxiety 08/24/2015  . Calculus of kidney   . Classic migraine 03/28/2015  . Elevated blood pressure   . Family history of cerebral aneurysm 03/28/2015  . Headache   . Herpes     PAST SURGICAL HISTORY: Past Surgical History:  Procedure Laterality Date  . ANTERIOR  CRUCIATE LIGAMENT REPAIR    . CESAREAN SECTION    . CESAREAN SECTION  07/02/2012   Procedure: CESAREAN SECTION;  Surgeon: Robley FriesVaishali R Mody, MD;  Location: [facility];  Service: Obstetrics;  Laterality: N/A;  EDD: 07/27/12/Repeat  . LEEP      FAMILY HISTORY: Family History  Problem Relation Age of Onset  . Aneurysm Maternal Aunt        cerebral  . Migraines Maternal Aunt   . Migraines Mother   . Migraines Sister   . Aneurysm Maternal Aunt   . Diabetes Paternal Uncle   . Diabetes Maternal Grandmother   . Melanoma Maternal Grandmother   . Stroke Maternal Grandmother   . Prostate cancer Paternal Grandfather   . High  blood pressure Paternal Grandmother     SOCIAL HISTORY: Social History   Socioeconomic History  . Marital status: Married    Spouse name: Not on file  . Number of children: 1  . Years of education: BS  . Highest education level: Not on file  Occupational History  . Not on file  Social Needs  . Financial resource strain: Not on file  . Food insecurity:    Worry: Not on file    Inability: Not on file  . Transportation needs:    Medical: Not on file    Non-medical: Not on file  Tobacco Use  . Smoking status: Never Smoker  . Smokeless tobacco: Never Used  Substance and Sexual Activity  . Alcohol use: Yes    Alcohol/week: 4.0 - 6.0 standard drinks    Types: 4 - 6 Glasses of wine per week  . Drug use: No  . Sexual activity: Not on file  Lifestyle  . Physical activity:    Days per week: Not on file    Minutes per session: Not on file  . Stress: Not on file  Relationships  . Social connections:    Talks on phone: Not on file    Gets together: Not on file    Attends religious service: Not on file    Active member of club or organization: Not on file    Attends meetings of clubs or organizations: Not on file    Relationship status: Not on file  . Intimate partner violence:    Fear of current or ex partner: Not on file    Emotionally abused: Not on file    Physically abused: Not on file    Forced sexual activity: Not on file  Other Topics Concern  . Not on file  Social History Narrative   Patient drinks 1-2 cups of caffeine daily.   Patient is right handed.      PHYSICAL EXAM  Vitals:   08/31/18 0726  BP: 110/79  Pulse: 80  Weight: 167 lb (75.8 kg)  Height: 5\' 2"  (1.575 m)   Body mass index is 30.54 kg/m.  Generalized: Well developed, in no acute distress   Neurological examination  Mentation: Alert oriented to time, place, history taking. Follows all commands speech and language fluent Cranial nerve II-XII: Pupils were equal round reactive to light.  Extraocular movements were full, visual field were full on confrontational test. Facial sensation and strength were normal. Uvula tongue midline. Head turning and shoulder shrug  were normal and symmetric. Motor: The motor testing reveals 5 over 5 strength of all 4 extremities. Good symmetric motor tone is noted throughout.  Sensory: Sensory testing is intact to soft touch on all 4 extremities. No evidence of extinction  is noted.  Coordination: Cerebellar testing reveals good finger-nose-finger and heel-to-shin bilaterally.  Gait and station: Gait is normal. Tandem gait is normal. Romberg is negative. No drift is seen.  Reflexes: Deep tendon reflexes are symmetric and normal bilaterally.   DIAGNOSTIC DATA (LABS, IMAGING, TESTING) - I reviewed patient records, labs, notes, testing and imaging myself where available.  Lab Results  Component Value Date   WBC 5.9 04/23/2015   HGB 13.5 04/23/2015   HCT 39.9 04/23/2015   MCV 85.7 04/23/2015   PLT 310.0 04/23/2015      Component Value Date/Time   NA 140 06/22/2015 1414   K 3.7 06/22/2015 1414   CL 110 06/22/2015 1414   CO2 21 06/22/2015 1414   GLUCOSE 90 06/22/2015 1414   BUN 14 06/22/2015 1414   CREATININE 0.81 06/22/2015 1414   CALCIUM 8.9 06/22/2015 1414   PROT 7.0 03/12/2015 2200   ALBUMIN 4.4 03/12/2015 2200   AST 16 03/12/2015 2200   ALT 14 03/12/2015 2200   ALKPHOS 67 03/12/2015 2200   BILITOT 0.2 (L) 03/12/2015 2200   GFRNONAA >60 03/12/2015 2200   GFRAA >60 03/12/2015 2200   No results found for: CHOL, HDL, LDLCALC, LDLDIRECT, TRIG, CHOLHDL No results found for: RUEA5W No results found for: VITAMINB12 Lab Results  Component Value Date   TSH 2.160 06/07/2018      ASSESSMENT AND PLAN 42 y.o. year old female  has a past medical history of Anxiety (08/24/2015), Calculus of kidney, Classic migraine (03/28/2015), Elevated blood pressure, Family history of cerebral aneurysm (03/28/2015), Headache, and Herpes. here with :  1.   Migraine headache  Overall the patient is doing well.  She will continue on Ajovy.  She will continue using Imitrex injections if needed.  I will also give her a prescription for oral Imitrex to use if she is at work and develops a migraine.  She is advised that she should not take oral Imitrex with Imitrex injection.  She voiced understanding.  She is advised that if her symptoms worsen or she develops new symptoms she should let us know.  She will follow-up in 6 months or sooner if needed.   I spent 15 minutes with the patient. 50% of this time was spent reviewing plan of care   Butch Penny, MSN, NP-C 08/31/2018, 7:40 AM [facility] [address] [place], [place] [address] [phone]

## 2018-08-31 NOTE — Patient Instructions (Addendum)
Your Plan:  Continue Ajovy and Imitrex Oral Imitrex sent in. Not to be taken with Imitrex injection.  If your symptoms worsen or you develop new symptoms please let us know.   Thank you for coming to see [facility] at [facility]. I hope we have been able to provide you high quality care today.  You may receive a patient satisfaction survey over the next few weeks. We would appreciate your feedback and comments so that we may continue to improve ourselves and the health of our patients.

## 2018-09-13 ENCOUNTER — Other Ambulatory Visit: Payer: Self-pay | Admitting: Adult Health

## 2018-09-21 DIAGNOSIS — M7711 Lateral epicondylitis, right elbow: Secondary | ICD-10-CM | POA: Diagnosis not present

## 2018-10-13 DIAGNOSIS — E663 Overweight: Secondary | ICD-10-CM | POA: Diagnosis not present

## 2018-10-13 DIAGNOSIS — I10 Essential (primary) hypertension: Secondary | ICD-10-CM | POA: Diagnosis not present

## 2018-10-26 ENCOUNTER — Other Ambulatory Visit: Payer: Self-pay | Admitting: Adult Health

## 2018-12-17 DIAGNOSIS — L03113 Cellulitis of right upper limb: Secondary | ICD-10-CM | POA: Diagnosis not present

## 2019-08-31 ENCOUNTER — Other Ambulatory Visit: Payer: Self-pay | Admitting: Obstetrics and Gynecology

## 2019-08-31 DIAGNOSIS — N6310 Unspecified lump in the right breast, unspecified quadrant: Secondary | ICD-10-CM

## 2019-09-01 ENCOUNTER — Ambulatory Visit
Admission: RE | Admit: 2019-09-01 | Discharge: 2019-09-01 | Disposition: A | Discharge status: Home / Self Care | Payer: 59 | Source: Ambulatory Visit | Attending: Obstetrics and Gynecology | Admitting: Obstetrics and Gynecology

## 2019-09-01 ENCOUNTER — Other Ambulatory Visit: Payer: Self-pay

## 2019-09-01 DIAGNOSIS — N6310 Unspecified lump in the right breast, unspecified quadrant: Secondary | ICD-10-CM

## 2019-09-05 ENCOUNTER — Ambulatory Visit: Payer: 59 | Admitting: Adult Health

## 2019-09-05 ENCOUNTER — Encounter: Payer: Self-pay | Admitting: Adult Health

## 2019-09-05 ENCOUNTER — Other Ambulatory Visit: Payer: Self-pay

## 2019-09-05 VITALS — BP 113/74 | HR 68 | Temp 98.0°F | Ht 62.0 in | Wt 173.4 lb

## 2019-09-05 DIAGNOSIS — G43009 Migraine without aura, not intractable, without status migrainosus: Secondary | ICD-10-CM | POA: Diagnosis not present

## 2019-09-05 NOTE — Patient Instructions (Signed)
Your Plan:  Continue: Ajovy and Imitrex If your symptoms worsen or you develop new symptoms please let us know.    Thank you for coming to see [place] at [facility]. I hope we have been able to provide you high quality care today.  You may receive a patient satisfaction survey over the next few weeks. We would appreciate your feedback and comments so that we may continue to improve ourselves and the health of our patients.

## 2019-09-05 NOTE — Progress Notes (Signed)
I have read the note, and I agree with the clinical assessment and plan.  Dalena Plantz K Faraaz Wolin   

## 2019-09-05 NOTE — Progress Notes (Signed)
PATIENT: Ashley Horne DOB: 10-15-1975  REASON FOR VISIT: follow up HISTORY FROM: patient  HISTORY OF PRESENT ILLNESS: Today 09/05/19 :  Ms. Wentz is a 43 year old female with a history of intractable migraine headaches.  She returns today for follow-up.  She is currently on Ajovy and Imitrex.  She reports that this is working well.  She states that she does not even have 1 migraine a month.  She states that she typically can take Imitrex injection and the headache will resolve within 30 minutes.  She denies any new symptoms.  She returns today for an evaluation.  HISTORY 08/31/18:  Ms. Strieter is a 43 year old female with a history of intractable migraine headaches.  She returns today for follow-up.  She is currently on Ajovy and tolerating it well.  She states that this has worked well for her headaches.  She states that she typically does not have any headaches until approximately 1 week before her next injection is due.  He was taking he did tell me he said mama that that was pretty sensitive because he had to billiards and right leg just all of a sudden finding his mullerectomy and I was watching leak in his face was just like like he was just so continue days like his headache until She states that these headaches rarely turn into migraines.  She continues to use Imitrex injection when she has a migraine.  She reports that her headaches typically resolve and half an hour.  She would like oral Imitrex to take at work.  She states that she cannot tolerate taking the injection while she is working.  She returns today for evaluation.   REVIEW OF SYSTEMS: Out of a complete 14 system review of symptoms, the patient complains only of the following symptoms, and all other reviewed systems are negative.  See HPI  ALLERGIES: No Known Allergies  HOME MEDICATIONS: Outpatient Medications Prior to Visit  Medication Sig Dispense Refill  . ALPRAZolam (XANAX) 0.5 MG tablet Take 0.5 mg by  mouth 3 (three) times daily as needed for anxiety.   0  . buPROPion (WELLBUTRIN XL) 150 MG 24 hr tablet Take 150 mg by mouth daily.    Marland Kitchen FLUoxetine (PROZAC) 20 MG capsule Take 20 mg by mouth daily.    . Fremanezumab-vfrm (AJOVY) 225 MG/1.5ML SOSY Inject 225 mg into the skin every 30 (thirty) days. 1 Syringe 11  . ibuprofen (ADVIL,MOTRIN) 200 MG tablet Take 200 mg by mouth every 6 (six) hours as needed.    Marland Kitchen LABETALOL HCL PO Take 1 tablet by mouth 2 (two) times daily.    Marland Kitchen spironolactone (ALDACTONE) 25 MG tablet Take 25 mg by mouth daily.    . SUMAtriptan (IMITREX) 100 MG tablet Take 1 tab at the onset of migraine. May repeat in 2 hours if needed. Not to exceed 2 tabs/24 hours. Not to be taken with Imitrex injection 10 tablet 2  . SUMAtriptan (IMITREX) 6 MG/0.5ML SOSY injection Inject 0.3ml at the onset of migraine. Can repeat in 2 hours if headache does not resolve. Not to exceed 2 injections in 24 hours. 8 Syringe 3  . valACYclovir (VALTREX) 500 MG tablet Take 500 mg by mouth daily as needed. Pt takes only during an outbreak     No facility-administered medications prior to visit.    PAST MEDICAL HISTORY: Past Medical History:  Diagnosis Date  . Anxiety 08/24/2015  . Calculus of kidney   . Classic migraine 03/28/2015  . Elevated blood  pressure   . Family history of cerebral aneurysm 03/28/2015  . Headache   . Herpes     PAST SURGICAL HISTORY: Past Surgical History:  Procedure Laterality Date  . ANTERIOR CRUCIATE LIGAMENT REPAIR    . CESAREAN SECTION    . CESAREAN SECTION  07/02/2012   Procedure: CESAREAN SECTION;  Surgeon: Elveria Royals, MD;  Location: [facility];  Service: Obstetrics;  Laterality: N/A;  EDD: 07/27/12/Repeat  . LEEP      FAMILY HISTORY: Family History  Problem Relation Age of Onset  . Aneurysm Maternal Aunt        cerebral  . Migraines Maternal Aunt   . Breast cancer Maternal Aunt   . Migraines Mother   . Migraines Sister   . Aneurysm Maternal Aunt   .  Diabetes Paternal Uncle   . Diabetes Maternal Grandmother   . Melanoma Maternal Grandmother   . Stroke Maternal Grandmother   . Prostate cancer Paternal Grandfather   . High blood pressure Paternal Grandmother     SOCIAL HISTORY: Social History   Socioeconomic History  . Marital status: Married    Spouse name: Not on file  . Number of children: 1  . Years of education: BS  . Highest education level: Not on file  Occupational History  . Not on file  Tobacco Use  . Smoking status: Never Smoker  . Smokeless tobacco: Never Used  Substance and Sexual Activity  . Alcohol use: Yes    Alcohol/week: 4.0 - 6.0 standard drinks    Types: 4 - 6 Glasses of wine per week  . Drug use: No  . Sexual activity: Not on file  Other Topics Concern  . Not on file  Social History Narrative   Patient drinks 1-2 cups of caffeine daily.   Patient is right handed.   Social Determinants of Health   Financial Resource Strain:   . Difficulty of Paying Living Expenses: Not on file  Food Insecurity:   . Worried About Charity fundraiser in the Last Year: Not on file  . Ran Out of Food in the Last Year: Not on file  Transportation Needs:   . Lack of Transportation (Medical): Not on file  . Lack of Transportation (Non-Medical): Not on file  Physical Activity:   . Days of Exercise per Week: Not on file  . Minutes of Exercise per Session: Not on file  Stress:   . Feeling of Stress : Not on file  Social Connections:   . Frequency of Communication with Friends and Family: Not on file  . Frequency of Social Gatherings with Friends and Family: Not on file  . Attends Religious Services: Not on file  . Active Member of Clubs or Organizations: Not on file  . Attends Archivist Meetings: Not on file  . Marital Status: Not on file  Intimate Partner Violence:   . Fear of Current or Ex-Partner: Not on file  . Emotionally Abused: Not on file  . Physically Abused: Not on file  . Sexually Abused:  Not on file      PHYSICAL EXAM  Vitals:   09/05/19 0758  BP: 113/74  Pulse: 68  Temp: 98 F (36.7 C)  TempSrc: Oral  Weight: 173 lb 6.4 oz (78.7 kg)  Height: 5\' 2"  (1.575 m)   Body mass index is 31.72 kg/m.  Generalized: Well developed, in no acute distress  Chest: Lungs clear to auscultation bilaterally  Neurological examination  Mentation: Alert oriented to  time, place, history taking. Follows all commands speech and language fluent Cranial nerve II-XII: Extraocular movements were full, visual field were full on confrontational test Head turning and shoulder shrug  were normal and symmetric. Motor: The motor testing reveals 5 over 5 strength of all 4 extremities. Good symmetric motor tone is noted throughout.  Sensory: Sensory testing is intact to soft touch on all 4 extremities. No evidence of extinction is noted.  Gait and station: Gait is normal.    DIAGNOSTIC DATA (LABS, IMAGING, TESTING) - I reviewed patient records, labs, notes, testing and imaging myself where available.  Lab Results  Component Value Date   WBC 5.9 04/23/2015   HGB 13.5 04/23/2015   HCT 39.9 04/23/2015   MCV 85.7 04/23/2015   PLT 310.0 04/23/2015      Component Value Date/Time   NA 140 06/22/2015 1414   K 3.7 06/22/2015 1414   CL 110 06/22/2015 1414   CO2 21 06/22/2015 1414   GLUCOSE 90 06/22/2015 1414   BUN 14 06/22/2015 1414   CREATININE 0.81 06/22/2015 1414   CALCIUM 8.9 06/22/2015 1414   PROT 7.0 03/12/2015 2200   ALBUMIN 4.4 03/12/2015 2200   AST 16 03/12/2015 2200   ALT 14 03/12/2015 2200   ALKPHOS 67 03/12/2015 2200   BILITOT 0.2 (L) 03/12/2015 2200   GFRNONAA >60 03/12/2015 2200   GFRAA >60 03/12/2015 2200    Lab Results  Component Value Date   TSH 2.160 06/07/2018      ASSESSMENT AND PLAN 43 y.o. year old female  has a past medical history of Anxiety (08/24/2015), Calculus of kidney, Classic migraine (03/28/2015), Elevated blood pressure, Family history of cerebral  aneurysm (03/28/2015), Headache, and Herpes. here with:  1: Migraine  -Continue Ajovy -Continue Imitrex injection and oral-patient knows not to take these together.  Advised that if patient's symptoms worsen or she develops new symptoms she should let us know.  She will follow-up in 1 year or sooner if needed.  I spent 15 minutes with the patient. 50% of this time was spent   Butch PennyMegan Louan Base, MSN, NP-C 09/05/2019, 8:06 AM [facility] [address], [address] [place], [place] [id number] [phone]

## 2019-09-19 ENCOUNTER — Other Ambulatory Visit: Payer: Self-pay | Admitting: Adult Health

## 2020-01-12 ENCOUNTER — Telehealth: Payer: Self-pay | Admitting: Adult Health

## 2020-01-12 NOTE — Telephone Encounter (Signed)
I called and spoke to pt.  She is needing sample is can get Monday is ok, as she states she has no insurance since Monday when her husband lost job.  I relayede can give her sample and then she can apply to PAP, she will sing enrollemnt form and see is she qualifies. This will be done on Monday.

## 2020-01-12 NOTE — Telephone Encounter (Signed)
Phone rep checked office voicemail's,at 3:46 pt left message stating she is without insurance and would like to know if the office has samples of Ajovy or if there is another medication Dr Anne Hahn would consider for her.  Pt stated without insurance she would be looking at over $700.00 a month.  Please call

## 2020-01-16 MED ORDER — AJOVY 225 MG/1.5ML ~~LOC~~ SOAJ: SUBCUTANEOUS | 0 refills | Status: DC

## 2020-01-16 NOTE — Telephone Encounter (Signed)
Completed ajovy pap form, needs MM/NP signature and approval for sample.

## 2020-01-16 NOTE — Addendum Note (Signed)
Addended by: Hermenia Fiscal S on: 01/16/2020 12:02 PM   Modules accepted: Orders

## 2020-01-16 NOTE — Telephone Encounter (Signed)
Pt here for AJOVY sample given autoinjector.  225mg  1.45ml.  NDC 4m given.  LOT [id number], 01/2021.

## 2020-06-12 ENCOUNTER — Telehealth: Payer: Self-pay | Admitting: Adult Health

## 2020-06-12 MED ORDER — AJOVY 225 MG/1.5ML ~~LOC~~ SOSY: PREFILLED_SYRINGE | SUBCUTANEOUS | 4 refills | Status: DC

## 2020-06-12 NOTE — Telephone Encounter (Signed)
Spoke with patient. She stated that she was receiving Ajovy from the manufacturer for most of the year. She recently qualified for Medicaid and can not longer receive Ajovy from the manufacturer. She will need to have the [facility] [address]. I advised her that I would send in a refill to last until her December appt with [facility]. She verbalized understanding. I also advised her that Medicaid may require a PA, in which I will go ahead and start one since she is already 3 days behind schedule. She verbalized understanding.   PA was started on [email or web address]. Key is [id number]. Per [email or web address], medication is already covered.   Nothing further needed at time of call.

## 2020-06-12 NOTE — Telephone Encounter (Signed)
Pt called, need prescription rewritten for Fremanezumab-vfrm (AJOVY) 225 MG/1.5ML SOAJ so medicaid will pay for it. [facility] [address] [place], [place] [address]

## 2020-06-19 ENCOUNTER — Telehealth: Payer: Self-pay | Admitting: Adult Health

## 2020-06-19 NOTE — Telephone Encounter (Signed)
Received another PA request for Ajovy. Original PA was already approved last week. This PA's key is [id number]. PA was instantly approved starting 06/19/20 to 06/19/21. Will fax a copy of the determination to patient's pharmacy.   Nothing further needed at time of call.

## 2020-08-02 ENCOUNTER — Telehealth: Payer: Self-pay | Admitting: Adult Health

## 2020-08-02 MED ORDER — SUMATRIPTAN SUCCINATE 6 MG/0.5ML ~~LOC~~ SOSY: PREFILLED_SYRINGE | SUBCUTANEOUS | 3 refills | Status: DC

## 2020-08-02 NOTE — Addendum Note (Signed)
Addended by: Hermenia Fiscal S on: 08/02/2020 12:00 PM   Modules accepted: Orders

## 2020-08-02 NOTE — Telephone Encounter (Signed)
Received a PA request for sumatriptan inj. PA was submitted via [email or web address]. Key: [id number]. PA was instantly approved 08/02/20 to 08/02/21. Will fax a copy of the approval to patient's pharmacy.

## 2020-08-02 NOTE — Telephone Encounter (Signed)
Pt. requests refill for SUMAtriptan (IMITREX) 6 MG/0.5ML SOSY injection. She states pharmacy is waiting for script to be sent in & she is completely out & having a migraine.  [phone]

## 2020-09-04 ENCOUNTER — Ambulatory Visit: Payer: 59 | Admitting: Adult Health

## 2020-09-04 ENCOUNTER — Telehealth (INDEPENDENT_AMBULATORY_CARE_PROVIDER_SITE_OTHER): Payer: Medicaid Other | Admitting: Adult Health

## 2020-09-04 DIAGNOSIS — G43009 Migraine without aura, not intractable, without status migrainosus: Secondary | ICD-10-CM

## 2020-09-04 MED ORDER — SUMATRIPTAN SUCCINATE 6 MG/0.5ML ~~LOC~~ SOSY: PREFILLED_SYRINGE | SUBCUTANEOUS | 3 refills | Status: DC

## 2020-09-04 MED ORDER — AJOVY 225 MG/1.5ML ~~LOC~~ SOSY: PREFILLED_SYRINGE | SUBCUTANEOUS | 11 refills | Status: DC

## 2020-09-04 NOTE — Progress Notes (Signed)
I have read the note, and I agree with the clinical assessment and plan.  Divya Munshi K Nyx Keady   

## 2020-09-04 NOTE — Progress Notes (Signed)
PATIENT: Ashley Horne DOB: 1976-04-14  REASON FOR VISIT: follow up HISTORY FROM: patient  Virtual Visit via Video Note  I connected with Ashley Horne on 09/04/20 at  3:15 PM EST by a video enabled telemedicine application located remotely at [facility] and verified that I am speaking with the correct person using two identifiers who was located at their own home.   I discussed the limitations of evaluation and management by telemedicine and the availability of in person appointments. The patient expressed understanding and agreed to proceed.   PATIENT: Ashley Horne DOB: 1975-11-03  REASON FOR VISIT: follow up HISTORY FROM: patient  HISTORY OF PRESENT ILLNESS: Today 09/04/20:  Ashley Horne is a 44 year old female with a history of intractable migraine headaches.  She returns today for follow-up.  She remains on Ajovy and Imitrex.  She states that in the last month to 2 months she has began having 1-2 headaches a week.  She does state that September and October she had a 6-week gap in taking Ajovy.  She reports that she continues to get benefit from Imitrex injections.  She prefers the syringe over the autoinjector.  HISTORY 09/05/19 :  Ashley Horne is a 44 year old female with a history of intractable migraine headaches.  She returns today for follow-up.  She is currently on Ajovy and Imitrex.  She reports that this is working well.  She states that she does not even have 1 migraine a month.  She states that she typically can take Imitrex injection and the headache will resolve within 30 minutes.  She denies any new symptoms.  She returns today for an evaluation.   REVIEW OF SYSTEMS: Out of a complete 14 system review of symptoms, the patient complains only of the following symptoms, and all other reviewed systems are negative.  See HPI  ALLERGIES: No Known Allergies  HOME MEDICATIONS: Outpatient Medications Prior to Visit  Medication Sig Dispense  Refill  . ALPRAZolam (XANAX) 0.5 MG tablet Take 0.5 mg by mouth 3 (three) times daily as needed for anxiety.   0  . buPROPion (WELLBUTRIN XL) 150 MG 24 hr tablet Take 150 mg by mouth daily.    Marland Kitchen FLUoxetine (PROZAC) 20 MG capsule Take 20 mg by mouth daily.    . Fremanezumab-vfrm (AJOVY) 225 MG/1.5ML SOAJ 225mg  1.1ml.  NDC 4m given.  LOT TBTE111c, 01/2021. (sample - one) 1 pen 0  . Fremanezumab-vfrm (AJOVY) 225 MG/1.5ML SOSY INJECT 225 MG INTO THE SKIN EVERY 30 (THIRTY) DAYS. 1.5 mL 4  . ibuprofen (ADVIL,MOTRIN) 200 MG tablet Take 200 mg by mouth every 6 (six) hours as needed.    02/2021 LABETALOL HCL PO Take 1 tablet by mouth 2 (two) times daily.    Marland Kitchen spironolactone (ALDACTONE) 25 MG tablet Take 25 mg by mouth daily.    . SUMAtriptan (IMITREX) 100 MG tablet Take 1 tab at the onset of migraine. May repeat in 2 hours if needed. Not to exceed 2 tabs/24 hours. Not to be taken with Imitrex injection 10 tablet 2  . SUMAtriptan (IMITREX) 6 MG/0.5ML SOSY injection Inject 0.36ml at the onset of migraine. Can repeat in 2 hours if headache does not resolve. Not to exceed 2 injections in 24 hours. 4 mL 3  . valACYclovir (VALTREX) 500 MG tablet Take 500 mg by mouth daily as needed. Pt takes only during an outbreak     No facility-administered medications prior to visit.    PAST MEDICAL HISTORY: Past Medical History:  Diagnosis Date  . Anxiety 08/24/2015  . Calculus of kidney   . Classic migraine 03/28/2015  . Elevated blood pressure   . Family history of cerebral aneurysm 03/28/2015  . Headache   . Herpes     PAST SURGICAL HISTORY: Past Surgical History:  Procedure Laterality Date  . ANTERIOR CRUCIATE LIGAMENT REPAIR    . CESAREAN SECTION    . CESAREAN SECTION  07/02/2012   Procedure: CESAREAN SECTION;  Surgeon: Robley Fries, MD;  Location: [facility];  Service: Obstetrics;  Laterality: N/A;  EDD: 07/27/12/Repeat  . LEEP      FAMILY HISTORY: Family History  Problem Relation Age of Onset  .  Aneurysm Maternal Aunt        cerebral  . Migraines Maternal Aunt   . Breast cancer Maternal Aunt   . Migraines Mother   . Migraines Sister   . Aneurysm Maternal Aunt   . Diabetes Paternal Uncle   . Diabetes Maternal Grandmother   . Melanoma Maternal Grandmother   . Stroke Maternal Grandmother   . Prostate cancer Paternal Grandfather   . High blood pressure Paternal Grandmother     SOCIAL HISTORY: Social History   Socioeconomic History  . Marital status: Married    Spouse name: Not on file  . Number of children: 1  . Years of education: BS  . Highest education level: Not on file  Occupational History  . Not on file  Tobacco Use  . Smoking status: Never Smoker  . Smokeless tobacco: Never Used  Substance and Sexual Activity  . Alcohol use: Yes    Alcohol/week: 4.0 - 6.0 standard drinks    Types: 4 - 6 Glasses of wine per week  . Drug use: No  . Sexual activity: Not on file  Other Topics Concern  . Not on file  Social History Narrative   Patient drinks 1-2 cups of caffeine daily.   Patient is right handed.   Social Determinants of Health   Financial Resource Strain: Not on file  Food Insecurity: Not on file  Transportation Needs: Not on file  Physical Activity: Not on file  Stress: Not on file  Social Connections: Not on file  Intimate Partner Violence: Not on file      PHYSICAL EXAM Generalized: Well developed, in no acute distress   Neurological examination  Mentation: Alert oriented to time, place, history taking. Follows all commands speech and language fluent Cranial nerve II-XII:Extraocular movements were full. Facial symmetry noted. uvula tongue midline. Head turning and shoulder shrug  were normal and symmetric. Motor: Good strength throughout subjectively per patient Sensory: Sensory testing is intact to soft touch on all 4 extremities subjectively per patient Coordination: Cerebellar testing reveals good finger-nose-finger  Gait and station:  Patient is able to stand from a seated position. gait is normal.  Reflexes: UTA  DIAGNOSTIC DATA (LABS, IMAGING, TESTING) - I reviewed patient records, labs, notes, testing and imaging myself where available.  Lab Results  Component Value Date   WBC 5.9 04/23/2015   HGB 13.5 04/23/2015   HCT 39.9 04/23/2015   MCV 85.7 04/23/2015   PLT 310.0 04/23/2015      Component Value Date/Time   NA 140 06/22/2015 1414   K 3.7 06/22/2015 1414   CL 110 06/22/2015 1414   CO2 21 06/22/2015 1414   GLUCOSE 90 06/22/2015 1414   BUN 14 06/22/2015 1414   CREATININE 0.81 06/22/2015 1414   CALCIUM 8.9 06/22/2015 1414   PROT 7.0 03/12/2015  2200   ALBUMIN 4.4 03/12/2015 2200   AST 16 03/12/2015 2200   ALT 14 03/12/2015 2200   ALKPHOS 67 03/12/2015 2200   BILITOT 0.2 (L) 03/12/2015 2200   GFRNONAA >60 03/12/2015 2200   GFRAA >60 03/12/2015 2200    Lab Results  Component Value Date   TSH 2.160 06/07/2018      ASSESSMENT AND PLAN 43 y.o. year old female  has a past medical history of Anxiety (08/24/2015), Calculus of kidney, Classic migraine (03/28/2015), Elevated blood pressure, Family history of cerebral aneurysm (03/28/2015), Headache, and Herpes. here with :  1.  Migraine headaches  --We'll continue Ajovy for the next 1 to 2 months.  If headache frequency does not decrease we will consider changing her medication --Continue Imitrex injections as abortive therapy --Follow-up in 6 months or sooner if needed   I spent 20 minutes of face-to-face and non-face-to-face time with patient.  This included previsit chart review, lab review, study review, order entry, electronic health record documentation, patient education.    Butch Penny, MSN, NP-C 09/04/2020, 3:38 PM [facility] [address], [address] [place], [place] [address] [phone]

## 2020-11-08 ENCOUNTER — Other Ambulatory Visit: Payer: Self-pay | Admitting: Adult Health

## 2021-03-05 ENCOUNTER — Telehealth (INDEPENDENT_AMBULATORY_CARE_PROVIDER_SITE_OTHER): Payer: Medicaid Other | Admitting: Adult Health

## 2021-03-05 DIAGNOSIS — G43009 Migraine without aura, not intractable, without status migrainosus: Secondary | ICD-10-CM

## 2021-03-05 NOTE — Progress Notes (Signed)
PATIENT: Ashley Horne DOB: Aug 17, 1976  REASON FOR VISIT: follow up HISTORY FROM: patient  Virtual Visit via Video Note  I connected with Ashley Horne on 03/05/21 at  2:15 PM EDT by a video enabled telemedicine application located remotely at [facility] and verified that I am speaking with the correct person using two identifiers who was located at their own home.   I discussed the limitations of evaluation and management by telemedicine and the availability of in person appointments. The patient expressed understanding and agreed to proceed.   PATIENT: Ashley Horne DOB: [birth year]  REASON FOR VISIT: follow up HISTORY FROM: patient  HISTORY OF PRESENT ILLNESS: Today 03/05/21:  Ashley Horne is a 45 year old female with a history of migraine headaches.  She returns today for follow-up.  She reports that her headaches have been under good control.  She is not even having 1 migraine a month.  She states that some months she does not have any.  She continues to use Imitrex injections.  She does use the tablet if she is at work.  Overall she feels that she is doing well.  09/04/20: Ashley Horne is a 45 year old female with a history of intractable migraine headaches.  She returns today for follow-up.  She remains on Ajovy and Imitrex.  She states that in the last month to 2 months she has began having 1-2 headaches a week.  She does state that September and October she had a 6-week gap in taking Ajovy.  She reports that she continues to get benefit from Imitrex injections.  She prefers the syringe over the autoinjector.  HISTORY 09/05/19 :   Ashley Horne is a 45 year old female with a history of intractable migraine headaches.  She returns today for follow-up.  She is currently on Ajovy and Imitrex.  She reports that this is working well.  She states that she does not even have 1 migraine a month.  She states that she typically can take Imitrex injection and the  headache will resolve within 30 minutes.  She denies any new symptoms.  She returns today for an evaluation.    REVIEW OF SYSTEMS: Out of a complete 14 system review of symptoms, the patient complains only of the following symptoms, and all other reviewed systems are negative.  See HPI  ALLERGIES: No Known Allergies  HOME MEDICATIONS: Outpatient Medications Prior to Visit  Medication Sig Dispense Refill   ALPRAZolam (XANAX) 0.5 MG tablet Take 0.5 mg by mouth 3 (three) times daily as needed for anxiety.   0   buPROPion (WELLBUTRIN XL) 150 MG 24 hr tablet Take 150 mg by mouth daily.     FLUoxetine (PROZAC) 20 MG capsule Take 20 mg by mouth daily.     Fremanezumab-vfrm (AJOVY) 225 MG/1.5ML SOSY INJECT 225 MG INTO THE SKIN EVERY 30 (THIRTY) DAYS. 1.5 mL 11   ibuprofen (ADVIL,MOTRIN) 200 MG tablet Take 200 mg by mouth every 6 (six) hours as needed.     LABETALOL HCL PO Take 1 tablet by mouth 2 (two) times daily.     spironolactone (ALDACTONE) 25 MG tablet Take 25 mg by mouth daily.     SUMAtriptan (IMITREX) 100 MG tablet Take 1 tab at the onset of migraine. May repeat in 2 hours if needed. Not to exceed 2 tabs/24 hours. Not to be taken with Imitrex injection 10 tablet 2   SUMAtriptan (IMITREX) 6 MG/0.5ML SOSY injection Inject 0.43ml at the onset of migraine. Can repeat in 2  hours if headache does not resolve. Not to exceed 2 injections in 24 hours. 4 mL 3   valACYclovir (VALTREX) 500 MG tablet Take 500 mg by mouth daily as needed. Pt takes only during an outbreak     No facility-administered medications prior to visit.    PAST MEDICAL HISTORY: Past Medical History:  Diagnosis Date   Anxiety 08/24/2015   Calculus of kidney    Classic migraine 03/28/2015   Elevated blood pressure    Family history of cerebral aneurysm 03/28/2015   Headache    Herpes     PAST SURGICAL HISTORY: Past Surgical History:  Procedure Laterality Date   ANTERIOR CRUCIATE LIGAMENT REPAIR     CESAREAN SECTION      CESAREAN SECTION  07/02/2012   Procedure: CESAREAN SECTION;  Surgeon: Robley Fries, MD;  Location: [facility];  Service: Obstetrics;  Laterality: N/A;  EDD: 07/27/12/Repeat   LEEP      FAMILY HISTORY: Family History  Problem Relation Age of Onset   Aneurysm Maternal Aunt        cerebral   Migraines Maternal Aunt    Breast cancer Maternal Aunt    Migraines Mother    Migraines Sister    Aneurysm Maternal Aunt    Diabetes Paternal Uncle    Diabetes Maternal Grandmother    Melanoma Maternal Grandmother    Stroke Maternal Grandmother    Prostate cancer Paternal Grandfather    High blood pressure Paternal Grandmother     SOCIAL HISTORY: Social History   Socioeconomic History   Marital status: Married    Spouse name: Not on file   Number of children: 1   Years of education: BS   Highest education level: Not on file  Occupational History   Not on file  Tobacco Use   Smoking status: Never   Smokeless tobacco: Never  Substance and Sexual Activity   Alcohol use: Yes    Alcohol/week: 4.0 - 6.0 standard drinks    Types: 4 - 6 Glasses of wine per week   Drug use: No   Sexual activity: Not on file  Other Topics Concern   Not on file  Social History Narrative   Patient drinks 1-2 cups of caffeine daily.   Patient is right handed.   Social Determinants of Health   Financial Resource Strain: Not on file  Food Insecurity: Not on file  Transportation Needs: Not on file  Physical Activity: Not on file  Stress: Not on file  Social Connections: Not on file  Intimate Partner Violence: Not on file      PHYSICAL EXAM Generalized: Well developed, in no acute distress   Neurological examination  Mentation: Alert oriented to time, place, history taking. Follows all commands speech and language fluent Cranial nerve II-XII:Extraocular movements were full. Facial symmetry noted. uvula tongue midline. Head turning and shoulder shrug  were normal and symmetric. Motor: Good  strength throughout subjectively per patient Sensory: Sensory testing is intact to soft touch on all 4 extremities subjectively per patient Coordination: Cerebellar testing reveals good finger-nose-finger  Gait and station: Patient is able to stand from a seated position. gait is normal.  Reflexes: UTA  DIAGNOSTIC DATA (LABS, IMAGING, TESTING) - I reviewed patient records, labs, notes, testing and imaging myself where available.  Lab Results  Component Value Date   WBC 5.9 04/23/2015   HGB 13.5 04/23/2015   HCT 39.9 04/23/2015   MCV 85.7 04/23/2015   PLT 310.0 04/23/2015  Component Value Date/Time   NA 140 06/22/2015 1414   K 3.7 06/22/2015 1414   CL 110 06/22/2015 1414   CO2 21 06/22/2015 1414   GLUCOSE 90 06/22/2015 1414   BUN 14 06/22/2015 1414   CREATININE 0.81 06/22/2015 1414   CALCIUM 8.9 06/22/2015 1414   PROT 7.0 03/12/2015 2200   ALBUMIN 4.4 03/12/2015 2200   AST 16 03/12/2015 2200   ALT 14 03/12/2015 2200   ALKPHOS 67 03/12/2015 2200   BILITOT 0.2 (L) 03/12/2015 2200   GFRNONAA >60 03/12/2015 2200   GFRAA >60 03/12/2015 2200    Lab Results  Component Value Date   TSH 2.160 06/07/2018      ASSESSMENT AND PLAN 45 y.o. year old female  has a past medical history of Anxiety (08/24/2015), Calculus of kidney, Classic migraine (03/28/2015), Elevated blood pressure, Family history of cerebral aneurysm (03/28/2015), Headache, and Herpes. here with :  1.  Migraine headaches  -Continue Ajovy monthly for prevention --Continue Imitrex injection or tablet for abortive therapy.  She is advised not to take the medication together. --Follow-up in 1 year or sooner if needed    Butch Penny, MSN, NP-C 03/05/2021, 2:11 PM [facility] [address], [address] [place], [place] [address] [phone]

## 2021-03-05 NOTE — Progress Notes (Signed)
I have read the note, and I agree with the clinical assessment and plan.  Sherri Levenhagen K Kymiah Araiza   

## 2021-05-23 ENCOUNTER — Telehealth: Payer: Self-pay

## 2021-05-23 NOTE — Telephone Encounter (Signed)
I submitted a PA request for Ajovy on [facility], Key: [facility] - PA Case ID: [id number]  Instant approval  PA Case: [id number], Status: Approved, Coverage Starts on: 05/23/2021 12:00:00 AM, Coverage Ends on: 05/23/2022 12:00:00 AM.

## 2021-07-04 ENCOUNTER — Emergency Department (HOSPITAL_COMMUNITY): Payer: Medicaid Other

## 2021-07-04 ENCOUNTER — Other Ambulatory Visit: Payer: Self-pay

## 2021-07-04 ENCOUNTER — Encounter (HOSPITAL_COMMUNITY): Payer: Self-pay | Admitting: Emergency Medicine

## 2021-07-04 ENCOUNTER — Emergency Department (HOSPITAL_COMMUNITY)
Admission: EM | Admit: 2021-07-04 | Discharge: 2021-07-04 | Disposition: A | Discharge status: Home / Self Care | Payer: Medicaid Other | Attending: Emergency Medicine | Admitting: Emergency Medicine

## 2021-07-04 DIAGNOSIS — R519 Headache, unspecified: Secondary | ICD-10-CM | POA: Diagnosis not present

## 2021-07-04 DIAGNOSIS — I10 Essential (primary) hypertension: Secondary | ICD-10-CM | POA: Diagnosis not present

## 2021-07-04 DIAGNOSIS — R42 Dizziness and giddiness: Secondary | ICD-10-CM | POA: Insufficient documentation

## 2021-07-04 LAB — CBC WITH DIFFERENTIAL/PLATELET
Abs Immature Granulocytes: 0.02 10*3/uL (ref 0.00–0.07)
Basophils Absolute: 0 10*3/uL (ref 0.0–0.1)
Basophils Relative: 0 %
Eosinophils Absolute: 0.1 10*3/uL (ref 0.0–0.5)
Eosinophils Relative: 2 %
HCT: 43 % (ref 36.0–46.0)
Hemoglobin: 14.1 g/dL (ref 12.0–15.0)
Immature Granulocytes: 0 %
Lymphocytes Relative: 36 %
Lymphs Abs: 2.9 10*3/uL (ref 0.7–4.0)
MCH: 28.2 pg (ref 26.0–34.0)
MCHC: 32.8 g/dL (ref 30.0–36.0)
MCV: 86 fL (ref 80.0–100.0)
Monocytes Absolute: 0.5 10*3/uL (ref 0.1–1.0)
Monocytes Relative: 7 %
Neutro Abs: 4.5 10*3/uL (ref 1.7–7.7)
Neutrophils Relative %: 55 %
Platelets: 348 10*3/uL (ref 150–400)
RBC: 5 MIL/uL (ref 3.87–5.11)
RDW: 13.4 % (ref 11.5–15.5)
WBC: 8.1 10*3/uL (ref 4.0–10.5)
nRBC: 0 % (ref 0.0–0.2)

## 2021-07-04 LAB — URINALYSIS, ROUTINE W REFLEX MICROSCOPIC
Bacteria, UA: NONE SEEN
Bilirubin Urine: NEGATIVE
Glucose, UA: NEGATIVE mg/dL
Ketones, ur: 5 mg/dL — AB
Leukocytes,Ua: NEGATIVE
Nitrite: NEGATIVE
Protein, ur: NEGATIVE mg/dL
Specific Gravity, Urine: 1.018 (ref 1.005–1.030)
pH: 5 (ref 5.0–8.0)

## 2021-07-04 LAB — BASIC METABOLIC PANEL
Anion gap: 8 (ref 5–15)
BUN: 11 mg/dL (ref 6–20)
CO2: 25 mmol/L (ref 22–32)
Calcium: 9.4 mg/dL (ref 8.9–10.3)
Chloride: 106 mmol/L (ref 98–111)
Creatinine, Ser: 0.75 mg/dL (ref 0.44–1.00)
GFR, Estimated: >60 mL/min (ref >60)
Glucose, Bld: 95 mg/dL (ref 70–99)
Potassium: 3.9 mmol/L (ref 3.5–5.1)
Sodium: 139 mmol/L (ref 135–145)

## 2021-07-04 LAB — PREGNANCY, URINE: Preg Test, Ur: NEGATIVE

## 2021-07-04 MED ORDER — MECLIZINE HCL 25 MG PO TABS
25.0000 mg | ORAL_TABLET | Freq: Once | ORAL | Status: AC
  Administered 2021-07-04: 25 mg via ORAL
  Filled 2021-07-04: qty 1

## 2021-07-04 MED ORDER — MECLIZINE HCL 25 MG PO TABS: 25.0000 mg | ORAL_TABLET | Freq: Three times a day (TID) | ORAL | 0 refills | Status: DC | PRN

## 2021-07-04 NOTE — Discharge Instructions (Signed)
Follow-up with your primary care doctor and with your neurologist.  Come back to ER if you develop any further episodes of passing out, worsening dizziness or other new concerning symptom.

## 2021-07-04 NOTE — ED Provider Notes (Signed)
[facility] EMERGENCY DEPARTMENT Provider Note   CSN: [id number] Arrival date & time: 07/04/21  1355     History Chief Complaint  Patient presents with   Dizziness    Ashley Horne is a 45 y.o. female.  Presented to the emergency room with concern for episode of feeling lightheaded and dizzy.  Patient states that while she was at work earlier today she suddenly felt off balance, had room spinning sensation and felt lightheaded like she was going to pass out.  These bad symptoms lasted for a couple minutes and then started to resolve.  Symptoms got better after sitting down on the ground, resting.  Denies any full syncope.  Felt a little bit off, slightly dizzy/lightheaded for the next hour or 2.  No associated numbness, weakness, speech or vision change.  Now has mild headache, feels similar to prior migraines.  Normally does not have this type of aura.  HPI     Past Medical History:  Diagnosis Date   Anxiety 08/24/2015   Calculus of kidney    Classic migraine 03/28/2015   Elevated blood pressure    Family history of cerebral aneurysm 03/28/2015   Headache    Herpes     Patient Active Problem List   Diagnosis Date Noted   Chest pain 07/27/2018   Palpitations 07/27/2018   Benign essential HTN 08/24/2015   Anxiety 08/24/2015   Numbness of toes 05/16/2015   Classic migraine 03/28/2015   Family history of cerebral aneurysm 03/28/2015   History of cesarean section, classical 07/03/2012   Cesarean delivery 07/03/2012   Postpartum care following classical cesarean section (10/18) 07/02/2012    Past Surgical History:  Procedure Laterality Date   ANTERIOR CRUCIATE LIGAMENT REPAIR     CESAREAN SECTION     CESAREAN SECTION  07/02/2012   Procedure: CESAREAN SECTION;  Surgeon: Robley Fries, MD;  Location: [facility];  Service: Obstetrics;  Laterality: N/A;  EDD: 07/27/12/Repeat   LEEP       OB History     Gravida  3   Para  2   Term      Preterm  2    AB  1   Living  2      SAB      IAB  1   Ectopic      Multiple      Live Births  2           Family History  Problem Relation Age of Onset   Aneurysm Maternal Aunt        cerebral   Migraines Maternal Aunt    Breast cancer Maternal Aunt    Migraines Mother    Migraines Sister    Aneurysm Maternal Aunt    Diabetes Paternal Uncle    Diabetes Maternal Grandmother    Melanoma Maternal Grandmother    Stroke Maternal Grandmother    Prostate cancer Paternal Grandfather    High blood pressure Paternal Grandmother     Social History   Tobacco Use   Smoking status: Never   Smokeless tobacco: Never  Substance Use Topics   Alcohol use: Yes    Alcohol/week: 4.0 - 6.0 standard drinks    Types: 4 - 6 Glasses of wine per week   Drug use: No    Home Medications Prior to Admission medications   Medication Sig Start Date End Date Taking? Authorizing Provider  ALPRAZolam Prudy Feeler) 0.5 MG tablet Take 0.5 mg by mouth 3 (three) times  daily as needed for anxiety.  03/22/15  Yes [provider]  ibuprofen (ADVIL,MOTRIN) 200 MG tablet Take 200 mg by mouth every 6 (six) hours as needed for mild pain.   Yes [provider]  meclizine (ANTIVERT) 25 MG tablet Take 1 tablet (25 mg total) by mouth 3 (three) times daily as needed for dizziness. 07/04/21  Yes Milagros Loll, MD  SUMAtriptan (IMITREX) 6 MG/0.5ML SOSY injection Inject 0.58ml at the onset of migraine. Can repeat in 2 hours if headache does not resolve. Not to exceed 2 injections in 24 hours. Patient taking differently: Inject 0.5 mg into the skin every 2 (two) hours as needed for migraine. Inject 0.62ml at the onset of migraine. Can repeat in 2 hours if headache does not resolve. Not to exceed 2 injections in 24 hours. 09/04/20  Yes Millikan, Megan, NP  Fremanezumab-vfrm (AJOVY) 225 MG/1.5ML SOSY INJECT 225 MG INTO THE SKIN EVERY 30 (THIRTY) DAYS. Patient taking differently: Inject 225 mg into the skin every 30  (thirty) days. 09/04/20   Butch Penny, NP  SUMAtriptan (IMITREX) 100 MG tablet Take 1 tab at the onset of migraine. May repeat in 2 hours if needed. Not to exceed 2 tabs/24 hours. Not to be taken with Imitrex injection Patient not taking: No sig reported 08/31/18   Butch Penny, NP    Allergies    Patient has no known allergies.  Review of Systems   Review of Systems  Constitutional:  Positive for fatigue. Negative for chills and fever.  HENT:  Negative for ear pain and sore throat.   Eyes:  Negative for pain and visual disturbance.  Respiratory:  Negative for cough and shortness of breath.   Cardiovascular:  Negative for chest pain and palpitations.  Gastrointestinal:  Negative for abdominal pain and vomiting.  Genitourinary:  Negative for dysuria and hematuria.  Musculoskeletal:  Negative for arthralgias and back pain.  Skin:  Negative for color change and rash.  Neurological:  Positive for dizziness, light-headedness and headaches. Negative for seizures and syncope.  All other systems reviewed and are negative.  Physical Exam Updated Vital Signs BP (!) 157/98   Pulse 73   Temp 98.5 F (36.9 C) (Oral)   Resp 17   SpO2 100%   Physical Exam Vitals and nursing note reviewed.  Constitutional:      General: She is not in acute distress.    Appearance: She is well-developed.  HENT:     Head: Normocephalic and atraumatic.  Eyes:     Conjunctiva/sclera: Conjunctivae normal.  Cardiovascular:     Rate and Rhythm: Normal rate and regular rhythm.     Heart sounds: No murmur heard. Pulmonary:     Effort: Pulmonary effort is normal. No respiratory distress.     Breath sounds: Normal breath sounds.  Abdominal:     Palpations: Abdomen is soft.     Tenderness: There is no abdominal tenderness.  Musculoskeletal:     Cervical back: Neck supple.  Skin:    General: Skin is warm and dry.  Neurological:     Mental Status: She is alert.     Comments: AAOx3 CN 2-12 intact,  speech clear visual fields intact 5/5 strength in b/l UE and LE Sensation to light touch intact in b/l UE and LE Normal FNF Normal gait  Psychiatric:        Mood and Affect: Mood normal.        Thought Content: Thought content normal.    ED Results /  Procedures / Treatments   Labs (all labs ordered are listed, but only abnormal results are displayed) Labs Reviewed  URINALYSIS, ROUTINE W REFLEX MICROSCOPIC - Abnormal; Notable for the following components:      Result Value   Hgb urine dipstick MODERATE (*)    Ketones, ur 5 (*)    All other components within normal limits  BASIC METABOLIC PANEL  CBC WITH DIFFERENTIAL/PLATELET  PREGNANCY, URINE    EKG EKG Interpretation  Date/Time:  Thursday July 04 2021 14:27:26 EDT Ventricular Rate:  79 PR Interval:  138 QRS Duration: 80 QT Interval:  366 QTC Calculation: 419 R Axis:   42 Text Interpretation: Normal sinus rhythm Cannot rule out Anterior infarct , age undetermined Abnormal ECG Confirmed by Marianna Fuss ([id number]) on 07/04/2021 4:43:58 PM  Radiology DG Chest 2 View  Result Date: 07/04/2021 CLINICAL DATA:  Dizziness, presyncope EXAM: CHEST - 2 VIEW COMPARISON:  None. FINDINGS: The heart size and mediastinal contours are within normal limits. No focal airspace consolidation, pleural effusion, or pneumothorax. The visualized skeletal structures are unremarkable. IMPRESSION: No active cardiopulmonary disease. Electronically Signed   By: Duanne Guess D.O.   On: 07/04/2021 15:30    Procedures Procedures   Medications Ordered in ED Medications  meclizine (ANTIVERT) tablet 25 mg (25 mg Oral Given 07/04/21 1733)    ED Course  I have reviewed the triage vital signs and the nursing notes.  Pertinent labs & imaging results that were available during my care of the patient were reviewed by me and considered in my medical decision making (see chart for details).    MDM Rules/Calculators/A&P                            45 year old lady presents to ER after having episode of lightheadedness/vertigo/near syncope.  On exam, patient appears well in no distress.  Normal neurologic exam.  No ongoing symptoms except for slight headache.  Does have history of prior migraines.  Basic labs are stable.  EKG without acute ischemic change and intervals within normal limits.  Chest x-ray unremarkable.  No events on telemetry monitoring.  Will discharge patient home.  Recommend follow-up with her neurologist and her primary care doctor.  Reviewed return precautions.  After the discussed management above, the patient was determined to be safe for discharge.  The patient was in agreement with this plan and all questions regarding their care were answered.  ED return precautions were discussed and the patient will return to the ED with any significant worsening of condition.  Final Clinical Impression(s) / ED Diagnoses Final diagnoses:  Dizziness    Rx / DC Orders ED Discharge Orders          Ordered    meclizine (ANTIVERT) 25 MG tablet  3 times daily PRN        07/04/21 1828             Milagros Loll, MD 07/05/21 1504

## 2021-07-04 NOTE — ED Provider Notes (Signed)
Emergency Medicine Provider Triage Evaluation Note  Ashley Horne , a 45 y.o. female  was evaluated in triage.  Pt complains of loss of balance.  Patient had a presyncopal event at work, she felt as if the world was moving and she was on a boat.  This lasted for few minutes, she had to sit on the ground and the sensation went away.  She denied feeling any chest pain or shortness of breath, did not have a syncopal event.  Patient reports she has a history of high blood pressure but has been off blood pressure medicine for almost 2 years after losing weight making diet changes.  Denies any headache, nausea, vomiting.  Review of Systems  Positive: Presyncope Negative: Nausea, vomiting, headache, chest pain, shortness of breath  Physical Exam  BP (!) 195/109   Pulse 98   Temp 98.4 F (36.9 C) (Oral)   Resp 16   SpO2 100%  Gen:   Awake, no distress   Resp:  Normal effort  MSK:   Moves extremities without difficulty  Other:  No nystagmus, cranial nerves III through XII grossly intact.  Medical Decision Making  Medically screening exam initiated at 2:36 PM.  Appropriate orders placed.  Ashley Horne was informed that the remainder of the evaluation will be completed by another provider, this initial triage assessment does not replace that evaluation, and the importance of remaining in the ED until their evaluation is complete.  Presyncope work-up.  Patient is hypertensive, will check for signs of endorgan damage as well.  Could be hypertensive urgency.   Theron Arista, PA-C 07/04/21 1438    Sloan Leiter, DO 07/05/21 Babette Relic

## 2021-07-04 NOTE — ED Triage Notes (Signed)
Pt states she was at work and felt dizzy suddenly. Pt slid herself to the floor to not pass out. Pt felt hot all over. Pt states she continued to feel rocky like she was on a boat.

## 2021-09-23 ENCOUNTER — Other Ambulatory Visit: Payer: Self-pay | Admitting: Adult Health

## 2022-03-04 ENCOUNTER — Telehealth: Payer: Medicaid Other | Admitting: Adult Health

## 2022-03-04 NOTE — Progress Notes (Deleted)
PATIENT: Ashley Horne DOB: [birth year]  REASON FOR VISIT: follow up HISTORY FROM: patient  Virtual Visit via Video Note  I connected with Ashley Horne on 03/04/22 at  1:15 PM EDT by a video enabled telemedicine application located remotely at [facility] and verified that I am speaking with the correct person using two identifiers who was located at their own home.   I discussed the limitations of evaluation and management by telemedicine and the availability of in person appointments. The patient expressed understanding and agreed to proceed.   PATIENT: Ashley Horne DOB: 15-Mar-1976  REASON FOR VISIT: follow up HISTORY FROM: patient  HISTORY OF PRESENT ILLNESS: Today 03/04/22:  Ashley Horne is a 46 year old female with a history of migraine headaches. She returns today for follow-up. Continue to use imitrex with good benefit. Headaches are not that often. Overall doing well.   03/05/21: Ashley Horne is a 46 year old female with a history of migraine headaches.  She returns today for follow-up.  She reports that her headaches have been under good control.  She is not even having 1 migraine a month.  She states that some months she does not have any.  She continues to use Imitrex injections.  She does use the tablet if she is at work.  Overall she feels that she is doing well.  09/04/20: Ashley Horne is a 46 year old female with a history of intractable migraine headaches.  She returns today for follow-up.  She remains on Ajovy and Imitrex.  She states that in the last month to 2 months she has began having 1-2 headaches a week.  She does state that September and October she had a 6-week gap in taking Ajovy.  She reports that she continues to get benefit from Imitrex injections.  She prefers the syringe over the autoinjector.  HISTORY 09/05/19 :   Ashley Horne is a 46 year old female with a history of intractable migraine headaches.  She returns today for follow-up.   She is currently on Ajovy and Imitrex.  She reports that this is working well.  She states that she does not even have 1 migraine a month.  She states that she typically can take Imitrex injection and the headache will resolve within 30 minutes.  She denies any new symptoms.  She returns today for an evaluation.    REVIEW OF SYSTEMS: Out of a complete 14 system review of symptoms, the patient complains only of the following symptoms, and all other reviewed systems are negative.  See HPI  ALLERGIES: No Known Allergies  HOME MEDICATIONS: Outpatient Medications Prior to Visit  Medication Sig Dispense Refill   ALPRAZolam (XANAX) 0.5 MG tablet Take 0.5 mg by mouth 3 (three) times daily as needed for anxiety.   0   Fremanezumab-vfrm (AJOVY) 225 MG/1.5ML SOSY INJECT 225 MG INTO THE SKIN EVERY 30 DAYS (SYRINGE) 1.5 mL 9   ibuprofen (ADVIL,MOTRIN) 200 MG tablet Take 200 mg by mouth every 6 (six) hours as needed for mild pain.     meclizine (ANTIVERT) 25 MG tablet Take 1 tablet (25 mg total) by mouth 3 (three) times daily as needed for dizziness. 30 tablet 0   SUMAtriptan (IMITREX) 100 MG tablet Take 1 tab at the onset of migraine. May repeat in 2 hours if needed. Not to exceed 2 tabs/24 hours. Not to be taken with Imitrex injection (Patient not taking: No sig reported) 10 tablet 2   SUMAtriptan (IMITREX) 6 MG/0.5ML SOSY injection Inject 0.76ml at  the onset of migraine. Can repeat in 2 hours if headache does not resolve. Not to exceed 2 injections in 24 hours. (Patient taking differently: Inject 0.5 mg into the skin every 2 (two) hours as needed for migraine. Inject 0.64ml at the onset of migraine. Can repeat in 2 hours if headache does not resolve. Not to exceed 2 injections in 24 hours.) 4 mL 3   No facility-administered medications prior to visit.    PAST MEDICAL HISTORY: Past Medical History:  Diagnosis Date   Anxiety 08/24/2015   Calculus of kidney    Classic migraine 03/28/2015   Elevated blood  pressure    Family history of cerebral aneurysm 03/28/2015   Headache    Herpes     PAST SURGICAL HISTORY: Past Surgical History:  Procedure Laterality Date   ANTERIOR CRUCIATE LIGAMENT REPAIR     CESAREAN SECTION     CESAREAN SECTION  07/02/2012   Procedure: CESAREAN SECTION;  Surgeon: Elveria Royals, MD;  Location: [facility];  Service: Obstetrics;  Laterality: N/A;  EDD: 07/27/12/Repeat   LEEP      FAMILY HISTORY: Family History  Problem Relation Age of Onset   Aneurysm Maternal Aunt        cerebral   Migraines Maternal Aunt    Breast cancer Maternal Aunt    Migraines Mother    Migraines Sister    Aneurysm Maternal Aunt    Diabetes Paternal Uncle    Diabetes Maternal Grandmother    Melanoma Maternal Grandmother    Stroke Maternal Grandmother    Prostate cancer Paternal Grandfather    High blood pressure Paternal Grandmother     SOCIAL HISTORY: Social History   Socioeconomic History   Marital status: Married    Spouse name: Not on file   Number of children: 1   Years of education: BS   Highest education level: Not on file  Occupational History   Not on file  Tobacco Use   Smoking status: Never   Smokeless tobacco: Never  Substance and Sexual Activity   Alcohol use: Yes    Alcohol/week: 4.0 - 6.0 standard drinks of alcohol    Types: 4 - 6 Glasses of wine per week   Drug use: No   Sexual activity: Not on file  Other Topics Concern   Not on file  Social History Narrative   Patient drinks 1-2 cups of caffeine daily.   Patient is right handed.   Social Determinants of Health   Financial Resource Strain: Not on file  Food Insecurity: Not on file  Transportation Needs: Not on file  Physical Activity: Not on file  Stress: Not on file  Social Connections: Not on file  Intimate Partner Violence: Not on file      PHYSICAL EXAM Generalized: Well developed, in no acute distress   Neurological examination  Mentation: Alert oriented to time, place, history  taking. Follows all commands speech and language fluent Cranial nerve II-XII:Extraocular movements were full. Facial symmetry noted. uvula tongue midline. Head turning and shoulder shrug  were normal and symmetric. Motor: Good strength throughout subjectively per patient Sensory: Sensory testing is intact to soft touch on all 4 extremities subjectively per patient Coordination: Cerebellar testing reveals good finger-nose-finger  Gait and station: Patient is able to stand from a seated position. gait is normal.  Reflexes: UTA  DIAGNOSTIC DATA (LABS, IMAGING, TESTING) - I reviewed patient records, labs, notes, testing and imaging myself where available.  Lab Results  Component Value Date  WBC 8.1 07/04/2021   HGB 14.1 07/04/2021   HCT 43.0 07/04/2021   MCV 86.0 07/04/2021   PLT 348 07/04/2021      Component Value Date/Time   NA 139 07/04/2021 1436   K 3.9 07/04/2021 1436   CL 106 07/04/2021 1436   CO2 25 07/04/2021 1436   GLUCOSE 95 07/04/2021 1436   BUN 11 07/04/2021 1436   CREATININE 0.75 07/04/2021 1436   CREATININE 0.81 06/22/2015 1414   CALCIUM 9.4 07/04/2021 1436   PROT 7.0 03/12/2015 2200   ALBUMIN 4.4 03/12/2015 2200   AST 16 03/12/2015 2200   ALT 14 03/12/2015 2200   ALKPHOS 67 03/12/2015 2200   BILITOT 0.2 (L) 03/12/2015 2200   GFRNONAA >60 07/04/2021 1436   GFRAA >60 03/12/2015 2200    Lab Results  Component Value Date   TSH 2.160 06/07/2018      ASSESSMENT AND PLAN 46 y.o. year old female  has a past medical history of Anxiety (08/24/2015), Calculus of kidney, Classic migraine (03/28/2015), Elevated blood pressure, Family history of cerebral aneurysm (03/28/2015), Headache, and Herpes. here with :  1.  Migraine headaches  -Continue Ajovy monthly for prevention --Continue Imitrex injection or tablet for abortive therapy.  She is advised not to take the medication together. --Follow-up in 1 year or sooner if needed    Butch Penny, MSN, NP-C  03/04/2022, 1:02 PM [facility] [address] [place], [place] [address] [phone]

## 2022-03-06 ENCOUNTER — Telehealth (INDEPENDENT_AMBULATORY_CARE_PROVIDER_SITE_OTHER): Payer: Medicaid Other | Admitting: Adult Health

## 2022-03-06 DIAGNOSIS — G43009 Migraine without aura, not intractable, without status migrainosus: Secondary | ICD-10-CM | POA: Diagnosis not present

## 2022-03-06 MED ORDER — EMGALITY 120 MG/ML ~~LOC~~ SOAJ: 120.0000 mg | SUBCUTANEOUS | 11 refills | Status: DC

## 2022-03-06 MED ORDER — SUMATRIPTAN SUCCINATE 6 MG/0.5ML ~~LOC~~ SOSY: PREFILLED_SYRINGE | SUBCUTANEOUS | 3 refills | Status: DC

## 2022-03-06 MED ORDER — UBRELVY 100 MG PO TABS: ORAL_TABLET | ORAL | 3 refills | Status: DC

## 2022-03-06 NOTE — Progress Notes (Signed)
PATIENT: Ashley Horne DOB: [birth year]  REASON FOR VISIT: follow up HISTORY FROM: patient  Virtual Visit via Video Note  I connected with Charlann Noss on 03/06/22 at  9:30 AM EDT by a video enabled telemedicine application located remotely at [facility] and verified that I am speaking with the correct person using two identifiers who was located at their own home.   I discussed the limitations of evaluation and management by telemedicine and the availability of in person appointments. The patient expressed understanding and agreed to proceed.   PATIENT: Ashley Horne DOB: Feb 03, 1976  REASON FOR VISIT: follow up HISTORY FROM: patient  HISTORY OF PRESENT ILLNESS: Today 03/06/22:  Ms. Ashley Horne is a 46 year old female with a history of migraine headaches. She returns today for follow-up.  Taking Ajovy but has is back to 3 headaches a week. This has been going on for the last 3 months. Continue to use imitrex with good benefit.  Using the oral imitrex- can take 1 hour to resolve. When she uses the injection it will resolve within 30 minutes. Doesnt take the injection while she is at work because it makes her jaw    03/05/21: Ms. Ashley Horne is a 46 year old female with a history of migraine headaches.  She returns today for follow-up.  She reports that her headaches have been under good control.  She is not even having 1 migraine a month.  She states that some months she does not have any.  She continues to use Imitrex injections.  She does use the tablet if she is at work.  Overall she feels that she is doing well.  09/04/20: Ms. Ashley Horne is a 46 year old female with a history of intractable migraine headaches.  She returns today for follow-up.  She remains on Ajovy and Imitrex.  She states that in the last month to 2 months she has began having 1-2 headaches a week.  She does state that September and October she had a 6-week gap in taking Ajovy.  She reports that she  continues to get benefit from Imitrex injections.  She prefers the syringe over the autoinjector.  HISTORY 09/05/19 :   Ms. Ashley Horne is a 46 year old female with a history of intractable migraine headaches.  She returns today for follow-up.  She is currently on Ajovy and Imitrex.  She reports that this is working well.  She states that she does not even have 1 migraine a month.  She states that she typically can take Imitrex injection and the headache will resolve within 30 minutes.  She denies any new symptoms.  She returns today for an evaluation.    REVIEW OF SYSTEMS: Out of a complete 14 system review of symptoms, the patient complains only of the following symptoms, and all other reviewed systems are negative.  See HPI  ALLERGIES: No Known Allergies  HOME MEDICATIONS: Outpatient Medications Prior to Visit  Medication Sig Dispense Refill   ALPRAZolam (XANAX) 0.5 MG tablet Take 0.5 mg by mouth 3 (three) times daily as needed for anxiety.   0   Fremanezumab-vfrm (AJOVY) 225 MG/1.5ML SOSY INJECT 225 MG INTO THE SKIN EVERY 30 DAYS (SYRINGE) 1.5 mL 9   ibuprofen (ADVIL,MOTRIN) 200 MG tablet Take 200 mg by mouth every 6 (six) hours as needed for mild pain.     meclizine (ANTIVERT) 25 MG tablet Take 1 tablet (25 mg total) by mouth 3 (three) times daily as needed for dizziness. 30 tablet 0   SUMAtriptan (IMITREX)  100 MG tablet Take 1 tab at the onset of migraine. May repeat in 2 hours if needed. Not to exceed 2 tabs/24 hours. Not to be taken with Imitrex injection (Patient not taking: No sig reported) 10 tablet 2   SUMAtriptan (IMITREX) 6 MG/0.5ML SOSY injection Inject 0.90ml at the onset of migraine. Can repeat in 2 hours if headache does not resolve. Not to exceed 2 injections in 24 hours. (Patient taking differently: Inject 0.5 mg into the skin every 2 (two) hours as needed for migraine. Inject 0.64ml at the onset of migraine. Can repeat in 2 hours if headache does not resolve. Not to exceed 2  injections in 24 hours.) 4 mL 3   No facility-administered medications prior to visit.    PAST MEDICAL HISTORY: Past Medical History:  Diagnosis Date   Anxiety 08/24/2015   Calculus of kidney    Classic migraine 03/28/2015   Elevated blood pressure    Family history of cerebral aneurysm 03/28/2015   Headache    Herpes     PAST SURGICAL HISTORY: Past Surgical History:  Procedure Laterality Date   ANTERIOR CRUCIATE LIGAMENT REPAIR     CESAREAN SECTION     CESAREAN SECTION  07/02/2012   Procedure: CESAREAN SECTION;  Surgeon: Robley Fries, MD;  Location: [facility];  Service: Obstetrics;  Laterality: N/A;  EDD: 07/27/12/Repeat   LEEP      FAMILY HISTORY: Family History  Problem Relation Age of Onset   Aneurysm Maternal Aunt        cerebral   Migraines Maternal Aunt    Breast cancer Maternal Aunt    Migraines Mother    Migraines Sister    Aneurysm Maternal Aunt    Diabetes Paternal Uncle    Diabetes Maternal Grandmother    Melanoma Maternal Grandmother    Stroke Maternal Grandmother    Prostate cancer Paternal Grandfather    High blood pressure Paternal Grandmother     SOCIAL HISTORY: Social History   Socioeconomic History   Marital status: Married    Spouse name: Not on file   Number of children: 1   Years of education: BS   Highest education level: Not on file  Occupational History   Not on file  Tobacco Use   Smoking status: Never   Smokeless tobacco: Never  Substance and Sexual Activity   Alcohol use: Yes    Alcohol/week: 4.0 - 6.0 standard drinks of alcohol    Types: 4 - 6 Glasses of wine per week   Drug use: No   Sexual activity: Not on file  Other Topics Concern   Not on file  Social History Narrative   Patient drinks 1-2 cups of caffeine daily.   Patient is right handed.   Social Determinants of Health   Financial Resource Strain: Not on file  Food Insecurity: Not on file  Transportation Needs: Not on file  Physical Activity: Not on file   Stress: Not on file  Social Connections: Not on file  Intimate Partner Violence: Not on file      PHYSICAL EXAM Generalized: Well developed, in no acute distress   Neurological examination  Mentation: Alert oriented to time, place, history taking. Follows all commands speech and language fluent Cranial nerve II-XII:Extraocular movements were full. Facial symmetry noted. uvula tongue midline. Head turning and shoulder shrug  were normal and symmetric. Motor: Good strength throughout subjectively per patient Reflexes: UTA  DIAGNOSTIC DATA (LABS, IMAGING, TESTING) - I reviewed patient records, labs, notes, testing  and imaging myself where available.  Lab Results  Component Value Date   WBC 8.1 07/04/2021   HGB 14.1 07/04/2021   HCT 43.0 07/04/2021   MCV 86.0 07/04/2021   PLT 348 07/04/2021      Component Value Date/Time   NA 139 07/04/2021 1436   K 3.9 07/04/2021 1436   CL 106 07/04/2021 1436   CO2 25 07/04/2021 1436   GLUCOSE 95 07/04/2021 1436   BUN 11 07/04/2021 1436   CREATININE 0.75 07/04/2021 1436   CREATININE 0.81 06/22/2015 1414   CALCIUM 9.4 07/04/2021 1436   PROT 7.0 03/12/2015 2200   ALBUMIN 4.4 03/12/2015 2200   AST 16 03/12/2015 2200   ALT 14 03/12/2015 2200   ALKPHOS 67 03/12/2015 2200   BILITOT 0.2 (L) 03/12/2015 2200   GFRNONAA >60 07/04/2021 1436   GFRAA >60 03/12/2015 2200    Lab Results  Component Value Date   TSH 2.160 06/07/2018      ASSESSMENT AND PLAN 46 y.o. year old female  has a past medical history of Anxiety (08/24/2015), Calculus of kidney, Classic migraine (03/28/2015), Elevated blood pressure, Family history of cerebral aneurysm (03/28/2015), Headache, and Herpes. here with :  1.  Migraine headaches  -- Continue Ajovy monthly for prevention --Continue Imitrex injection or tablet for abortive therapy.  She is advised not to take the medication together. -- Patient will do a trial of Ubrelvy for abortive therapy advised to take  100 mg at the onset of a migraine can repeat in 2 hours if needed.  Advised to not take with Imitrex --Follow-up in 4 month or sooner if needed    Butch Penny, MSN, NP-C 03/06/2022, 9:40 AM [facility] [address], [address] [place], [place] [address] [phone]

## 2022-03-07 ENCOUNTER — Telehealth: Payer: Self-pay | Admitting: *Deleted

## 2022-03-10 ENCOUNTER — Telehealth: Payer: Self-pay | Admitting: *Deleted

## 2022-03-10 NOTE — Telephone Encounter (Signed)
Completed Sumatriptan injection PA on Cover My Meds. Key: [id number]. Approved immediately by [facility]  PA Case: [id number], Status: Approved, Coverage Starts on: 03/10/2022 12:00:00 AM, Coverage Ends on: 03/10/2023 12:00:00 AM.  Approval letter faxed to pharmacy. Received a receipt of confirmation.

## 2022-03-12 NOTE — Telephone Encounter (Signed)
Completed a new PA on Cover My Meds. Key: [id number]. Approved immediately by [employer] [facility].   PA Case: [id number], Status: Approved, Coverage Starts on: 03/12/2022 12:00:00 AM, Coverage Ends on: 03/12/2023 12:00:00 AM.   Message sent to pt via mychart.

## 2022-03-13 ENCOUNTER — Encounter: Payer: Self-pay | Admitting: Adult Health

## 2022-03-13 ENCOUNTER — Telehealth: Payer: Self-pay | Admitting: *Deleted

## 2022-03-13 NOTE — Telephone Encounter (Signed)
Emgality PA completed on Cover My Meds. Was pending but not sent quite yet. Insurance approved immediately. Key: [id number].   PA Case: [id number], Status: Approved, Coverage Starts on: 03/13/2022 12:00:00 AM, Coverage Ends on: 06/11/2022 12:00:00 AM.

## 2022-06-24 ENCOUNTER — Telehealth: Payer: Self-pay | Admitting: *Deleted

## 2022-06-24 NOTE — Telephone Encounter (Signed)
Emgality PA, Key: [id number], G43.009. A Case: [id number], Status: Approved, Coverage Starts on: 06/24/2022 12:00:00 AM, Coverage Ends on: 06/24/2023 12:00:00 AM Faxed approval to pharmacy.

## 2022-06-27 ENCOUNTER — Telehealth: Payer: Medicaid Other | Admitting: Adult Health

## 2022-06-27 DIAGNOSIS — G43E09 Chronic migraine with aura, not intractable, without status migrainosus: Secondary | ICD-10-CM

## 2022-06-27 DIAGNOSIS — G43009 Migraine without aura, not intractable, without status migrainosus: Secondary | ICD-10-CM

## 2022-06-27 NOTE — Progress Notes (Signed)
PATIENT: Ashley Horne DOB: [birth year]  REASON FOR VISIT: follow up HISTORY FROM: patient  Virtual Visit via Video Note  I connected with Ashley Horne on 06/27/22 at 11:45 AM EDT by a video enabled telemedicine application located remotely at [facility] and verified that I am speaking with the correct person using two identifiers who was located at their own home.   I discussed the limitations of evaluation and management by telemedicine and the availability of in person appointments. The patient expressed understanding and agreed to proceed.   PATIENT: Ashley Horne DOB: 22-Aug-1976  REASON FOR VISIT: follow up HISTORY FROM: patient  HISTORY OF PRESENT ILLNESS: Today 06/27/22: Ashley Horne is a 46 year old female with a history of migraine headaches.  She returns today for follow-up.  She is Currently on Emgality. Has taken it 3-4 months. Currently having 2-3 headaches but states that she was like taking her medicine about 2 weeks.  She uses Roselyn Meier as abortive therapy and that tends to work well for her.  She does state that she would be interested in trying a different therapy.  Reports that the Emgality injections are painful.  Without taking Emgality or any medication she would have a daily headache.  03/06/22: Ashley Horne is a 46 year old female with a history of migraine headaches. She returns today for follow-up.  Taking Ajovy but has is back to 3 headaches a week. This has been going on for the last 3 months. Continue to use imitrex with good benefit.  Using the oral imitrex- can take 1 hour to resolve. When she uses the injection it will resolve within 30 minutes. Doesnt take the injection while she is at work because it makes her jaw    03/05/21: Ashley Horne is a 46 year old female with a history of migraine headaches.  She returns today for follow-up.  She reports that her headaches have been under good control.  She is not even having 1 migraine a month.   She states that some months she does not have any.  She continues to use Imitrex injections.  She does use the tablet if she is at work.  Overall she feels that she is doing well.  09/04/20: Ashley Horne is a 46 year old female with a history of intractable migraine headaches.  She returns today for follow-up.  She remains on Ajovy and Imitrex.  She states that in the last month to 2 months she has began having 1-2 headaches a week.  She does state that September and October she had a 6-week gap in taking Ajovy.  She reports that she continues to get benefit from Imitrex injections.  She prefers the syringe over the autoinjector.  HISTORY 09/05/19 :   Ashley Horne is a 46 year old female with a history of intractable migraine headaches.  She returns today for follow-up.  She is currently on Ajovy and Imitrex.  She reports that this is working well.  She states that she does not even have 1 migraine a month.  She states that she typically can take Imitrex injection and the headache will resolve within 30 minutes.  She denies any new symptoms.  She returns today for an evaluation.    REVIEW OF SYSTEMS: Out of a complete 14 system review of symptoms, the patient complains only of the following symptoms, and all other reviewed systems are negative.  See HPI  ALLERGIES: No Known Allergies  HOME MEDICATIONS: Outpatient Medications Prior to Visit  Medication Sig Dispense Refill  ALPRAZolam (XANAX) 0.5 MG tablet Take 0.5 mg by mouth 3 (three) times daily as needed for anxiety.   0   Galcanezumab-gnlm (EMGALITY) 120 MG/ML SOAJ Inject 120 mg into the skin every 30 (thirty) days. 1.12 mL 11   ibuprofen (ADVIL,MOTRIN) 200 MG tablet Take 200 mg by mouth every 6 (six) hours as needed for mild pain.     meclizine (ANTIVERT) 25 MG tablet Take 1 tablet (25 mg total) by mouth 3 (three) times daily as needed for dizziness. 30 tablet 0   SUMAtriptan (IMITREX) 100 MG tablet Take 1 tab at the onset of migraine. May  repeat in 2 hours if needed. Not to exceed 2 tabs/24 hours. Not to be taken with Imitrex injection (Patient not taking: No sig reported) 10 tablet 2   SUMAtriptan (IMITREX) 6 MG/0.5ML SOSY injection Inject 0.57ml at the onset of migraine. Can repeat in 2 hours if headache does not resolve. Not to exceed 2 injections in 24 hours. 4 mL 3   Ubrogepant (UBRELVY) 100 MG TABS Take 1 tablet at the onset of migraine. Repeat in 2 hours if needed. 15 tablet 3   No facility-administered medications prior to visit.    PAST MEDICAL HISTORY: Past Medical History:  Diagnosis Date   Anxiety 08/24/2015   Calculus of kidney    Classic migraine 03/28/2015   Elevated blood pressure    Family history of cerebral aneurysm 03/28/2015   Headache    Herpes     PAST SURGICAL HISTORY: Past Surgical History:  Procedure Laterality Date   ANTERIOR CRUCIATE LIGAMENT REPAIR     CESAREAN SECTION     CESAREAN SECTION  07/02/2012   Procedure: CESAREAN SECTION;  Surgeon: Robley Fries, MD;  Location: [facility];  Service: Obstetrics;  Laterality: N/A;  EDD: 07/27/12/Repeat   LEEP      FAMILY HISTORY: Family History  Problem Relation Age of Onset   Aneurysm Maternal Aunt        cerebral   Migraines Maternal Aunt    Breast cancer Maternal Aunt    Migraines Mother    Migraines Sister    Aneurysm Maternal Aunt    Diabetes Paternal Uncle    Diabetes Maternal Grandmother    Melanoma Maternal Grandmother    Stroke Maternal Grandmother    Prostate cancer Paternal Grandfather    High blood pressure Paternal Grandmother     SOCIAL HISTORY: Social History   Socioeconomic History   Marital status: Married    Spouse name: Not on file   Number of children: 1   Years of education: BS   Highest education level: Not on file  Occupational History   Not on file  Tobacco Use   Smoking status: Never   Smokeless tobacco: Never  Substance and Sexual Activity   Alcohol use: Yes    Alcohol/week: 4.0 - 6.0 standard  drinks of alcohol    Types: 4 - 6 Glasses of wine per week   Drug use: No   Sexual activity: Not on file  Other Topics Concern   Not on file  Social History Narrative   Patient drinks 1-2 cups of caffeine daily.   Patient is right handed.   Social Determinants of Health   Financial Resource Strain: Not on file  Food Insecurity: Not on file  Transportation Needs: Not on file  Physical Activity: Not on file  Stress: Not on file  Social Connections: Not on file  Intimate Partner Violence: Not on file  PHYSICAL EXAM Generalized: Well developed, in no acute distress   Neurological examination  Mentation: Alert oriented to time, place, history taking. Follows all commands speech and language fluent  Reflexes: UTA  DIAGNOSTIC DATA (LABS, IMAGING, TESTING) - I reviewed patient records, labs, notes, testing and imaging myself where available.  Lab Results  Component Value Date   WBC 8.1 07/04/2021   HGB 14.1 07/04/2021   HCT 43.0 07/04/2021   MCV 86.0 07/04/2021   PLT 348 07/04/2021      Component Value Date/Time   NA 139 07/04/2021 1436   K 3.9 07/04/2021 1436   CL 106 07/04/2021 1436   CO2 25 07/04/2021 1436   GLUCOSE 95 07/04/2021 1436   BUN 11 07/04/2021 1436   CREATININE 0.75 07/04/2021 1436   CREATININE 0.81 06/22/2015 1414   CALCIUM 9.4 07/04/2021 1436   PROT 7.0 03/12/2015 2200   ALBUMIN 4.4 03/12/2015 2200   AST 16 03/12/2015 2200   ALT 14 03/12/2015 2200   ALKPHOS 67 03/12/2015 2200   BILITOT 0.2 (L) 03/12/2015 2200   GFRNONAA >60 07/04/2021 1436   GFRAA >60 03/12/2015 2200    Lab Results  Component Value Date   TSH 2.160 06/07/2018      ASSESSMENT AND PLAN 46 y.o. year old female  has a past medical history of Anxiety (08/24/2015), Calculus of kidney, Classic migraine (03/28/2015), Elevated blood pressure, Family history of cerebral aneurysm (03/28/2015), Headache, and Herpes. here with :  1.  Migraine headaches  --We will start Botox  therapy for the patient -- She will continue Emgality until we get her on Botox and this will be discontinued -- Continue Ubrelvy for abortive therapy --Advised if symptoms worsen or she develops new symptoms she should let us know -- Once Botox approved we will get her in for next appointment    Butch Penny, MSN, NP-C 06/27/2022, 8:29 AM [facility] [address] [place], [place] [address] [phone]

## 2022-07-10 ENCOUNTER — Telehealth: Payer: Self-pay | Admitting: *Deleted

## 2022-07-10 NOTE — Telephone Encounter (Signed)
Could try qulipta if she feels emgality is not working. Would have to stop emgality

## 2022-07-10 NOTE — Telephone Encounter (Signed)
I spoke with the patient.  She states most every migraine she has includes an aura. She states she calls them floaters.  Sometimes she will get the aura without the migraine.  She states that she does not have 8 migraines per month without an aura.  Overall she reports 20 headache days per month and of those days, she has 8-10 migraine days per month.  She is currently on Emgality and uses Ubrelvy as needed.  She is aware that it is okay for her to go ahead and take the [place] when she gets a migraine aura to try to keep it from turning into a full blown migraine.  Try to avoid taking acute migraine medications more than 2-3 times per week. I told her I would discuss with Jinny Blossom the next steps and call her back. She was very Patent attorney.

## 2022-07-24 NOTE — Telephone Encounter (Signed)
Does she want to switch to qulipta?

## 2022-07-28 MED ORDER — PREDNISONE 5 MG PO TABS: ORAL_TABLET | ORAL | 0 refills | Status: DC

## 2022-07-28 NOTE — Addendum Note (Signed)
Addended by: Enedina Finner on: 07/28/2022 04:09 PM   Modules accepted: Orders

## 2022-07-29 MED ORDER — QULIPTA 60 MG PO TABS: 60.0000 mg | ORAL_TABLET | Freq: Every day | ORAL | 5 refills | Status: DC

## 2022-07-29 NOTE — Addendum Note (Signed)
Addended by: Enedina Finner on: 07/29/2022 02:52 PM   Modules accepted: Orders

## 2022-07-30 NOTE — Telephone Encounter (Signed)
Ok to give sample if we have it

## 2022-07-31 ENCOUNTER — Telehealth: Payer: Self-pay | Admitting: *Deleted

## 2022-07-31 MED ORDER — EMGALITY 120 MG/ML ~~LOC~~ SOAJ: 120.0000 mg | SUBCUTANEOUS | 0 refills | Status: DC

## 2022-07-31 NOTE — Addendum Note (Signed)
Addended by: Guy Begin on: 07/31/2022 09:15 AM   Modules accepted: Orders

## 2022-07-31 NOTE — Telephone Encounter (Signed)
Pt picked up emgality sample (box of 2 yesterday. ( LOT [id number]) exp 03-16-2024).

## 2022-07-31 NOTE — Telephone Encounter (Signed)
Completed Qulipta PA on CMM. Key: [id number].  [facility] [place]   Approved today [place] Case: [id number], Status: Approved, Coverage Starts on: 07/31/2022 12:00:00 AM, Coverage Ends on: 10/29/2022 12:00:00 AM.

## 2022-07-31 NOTE — Telephone Encounter (Signed)
Start qulipta in 2 weeks. Don't take emgality in December

## 2022-08-12 ENCOUNTER — Other Ambulatory Visit (HOSPITAL_COMMUNITY)
Admission: RE | Admit: 2022-08-12 | Discharge: 2022-08-12 | Disposition: A | Discharge status: Home / Self Care | Payer: Medicaid Other | Source: Ambulatory Visit | Attending: Nurse Practitioner | Admitting: Nurse Practitioner

## 2022-08-12 DIAGNOSIS — Z124 Encounter for screening for malignant neoplasm of cervix: Secondary | ICD-10-CM | POA: Diagnosis not present

## 2022-08-12 DIAGNOSIS — Z9889 Other specified postprocedural states: Secondary | ICD-10-CM | POA: Diagnosis present

## 2022-08-15 LAB — CYTOLOGY - PAP
Comment: NEGATIVE
Diagnosis: NEGATIVE
Diagnosis: REACTIVE
High risk HPV: NEGATIVE

## 2022-09-15 DIAGNOSIS — G4730 Sleep apnea, unspecified: Secondary | ICD-10-CM

## 2022-09-15 HISTORY — DX: Sleep apnea, unspecified: G47.30

## 2022-11-11 NOTE — Telephone Encounter (Signed)
Completed renewal PA for Qulipta on CMM. Key: [id number]. Awaiting determination from [place] [employer].

## 2023-02-09 ENCOUNTER — Other Ambulatory Visit: Payer: Self-pay | Admitting: Adult Health

## 2023-02-13 ENCOUNTER — Other Ambulatory Visit (HOSPITAL_COMMUNITY): Payer: Self-pay

## 2023-02-20 ENCOUNTER — Telehealth: Payer: Self-pay | Admitting: Adult Health

## 2023-02-20 NOTE — Telephone Encounter (Signed)
Pt reports that her Medicaid ran out in May and her new insurance will not be active before 07-01, pt will be without the QULIPTA 60 MG TABS , until then.  Pt is asking if Aundra Millet, NP has any suggestions as to what she can do while waiting for her new insurance to take affect.

## 2023-02-23 MED ORDER — QULIPTA 60 MG PO TABS: 1.0000 | ORAL_TABLET | Freq: Every day | ORAL | 0 refills | Status: DC

## 2023-02-23 NOTE — Telephone Encounter (Signed)
Ok do to Samples until we can do a new PA

## 2023-02-23 NOTE — Telephone Encounter (Signed)
I spoke with the patient.  She is appreciative of the samples.  She states she has been out of Qulipta for 10 days and at first she was ok but within the last few days she has been getting headaches.  She will submit her new insurance to her pharmacy and our office when she gets it.  If a PA is required for the [place], we will do it.  If the PA is denied then we will need to discuss another option.  The patient states the Emgality worked okay and she would be willing to go back on that if her new insurance does not cover [employer].  She was appreciative for the call and she will come this afternoon to pick up samples.

## 2023-02-23 NOTE — Addendum Note (Signed)
Addended by: Bertram Savin on: 02/23/2023 11:13 AM   Modules accepted: Orders

## 2023-02-23 NOTE — Telephone Encounter (Signed)
Order placed for Qulipta 60 mg samples, Take 1 tablet by mouth per day. Samples are ready for patient to pickup.

## 2023-03-14 ENCOUNTER — Other Ambulatory Visit (HOSPITAL_COMMUNITY): Payer: Self-pay

## 2023-03-24 ENCOUNTER — Encounter: Payer: Self-pay | Admitting: Adult Health

## 2023-03-24 DIAGNOSIS — F419 Anxiety disorder, unspecified: Secondary | ICD-10-CM | POA: Diagnosis not present

## 2023-03-24 DIAGNOSIS — E88819 Insulin resistance, unspecified: Secondary | ICD-10-CM | POA: Diagnosis not present

## 2023-03-24 DIAGNOSIS — K5903 Drug induced constipation: Secondary | ICD-10-CM | POA: Diagnosis not present

## 2023-03-24 NOTE — Telephone Encounter (Signed)
Attempted PA for Qulipta via CMM. Sent to [employer]. Key: [id number]. Received this response: "[employer] [place] is processing your PA request and will respond shortly with next steps. You may close this dialog, return to your dashboard, and perform other tasks. To check for an update later, open this request again from your dashboard.  If you need assistance, please chat with CoverMyMeds or call us at [phone]."

## 2023-03-24 NOTE — Telephone Encounter (Signed)
Completed clinical question set for [place] PA & sent to [facility]. Should have a determination within 1-3 business days. Key: [id number].

## 2023-03-25 NOTE — Telephone Encounter (Signed)
PA for Bennie Pierini was approved by [employer]:

## 2023-03-31 ENCOUNTER — Other Ambulatory Visit (HOSPITAL_COMMUNITY): Payer: Self-pay

## 2023-05-28 DIAGNOSIS — E785 Hyperlipidemia, unspecified: Secondary | ICD-10-CM | POA: Diagnosis not present

## 2023-05-28 DIAGNOSIS — E559 Vitamin D deficiency, unspecified: Secondary | ICD-10-CM | POA: Diagnosis not present

## 2023-05-28 DIAGNOSIS — I10 Essential (primary) hypertension: Secondary | ICD-10-CM | POA: Diagnosis not present

## 2023-05-28 DIAGNOSIS — K5903 Drug induced constipation: Secondary | ICD-10-CM | POA: Diagnosis not present

## 2023-06-25 ENCOUNTER — Telehealth: Payer: Self-pay | Admitting: Adult Health

## 2023-06-25 ENCOUNTER — Telehealth: Payer: Self-pay | Admitting: Pharmacy Technician

## 2023-06-25 ENCOUNTER — Other Ambulatory Visit (HOSPITAL_COMMUNITY): Payer: Self-pay

## 2023-06-25 MED ORDER — QULIPTA 60 MG PO TABS: 60.0000 mg | ORAL_TABLET | Freq: Every day | ORAL | 0 refills | Status: DC

## 2023-06-25 NOTE — Telephone Encounter (Addendum)
Per Aundra Millet NP ok to give 4 tablets of Quliplita  because pt is out of tablets and waiting on approval for medication  Pt  states ok and will come by tomorrow between 8-12pm to  pick up samples Per Pt Husband Aki Burdin may come instead of  her . Pt thanked me for calling  PA Bennie Pierini was sent to PA team this am

## 2023-06-25 NOTE — Telephone Encounter (Signed)
Pt has been informed that a PA is needed on her Atogepant (QULIPTA) 60 MG TABS , she is asking if since this medication is one that she takes daily is there an alternate that can be called in or are there samples she can come to the office for while waiting for the PA to be resolved, please call.

## 2023-06-25 NOTE — Addendum Note (Signed)
Addended by: Raynald Kemp A on: 06/25/2023 05:21 PM   Modules accepted: Orders

## 2023-06-25 NOTE — Telephone Encounter (Signed)
Pharmacy Patient Advocate Encounter   Received notification from CoverMyMeds that prior authorization for Qulipta 60MG  tablets is required/requested.   Insurance verification completed.   The patient is insured through [facility] .   Per test claim: PA required; PA started via CoverMyMeds. KEY [id number] . Waiting for clinical questions to populate.

## 2023-06-26 NOTE — Telephone Encounter (Signed)
Clinical questions have been submitted-awaiting determination. 

## 2023-07-01 ENCOUNTER — Other Ambulatory Visit (HOSPITAL_COMMUNITY): Payer: Self-pay

## 2023-07-01 NOTE — Telephone Encounter (Signed)
Pharmacy Patient Advocate Encounter  Received notification from [facility] that Prior Authorization for Qulipta 60MG  tablets has been APPROVED from 06/29/2023 to 06/25/2024. Ran test claim, Copay is $Unable to obtain due to refill too soon rejection-LFD: 06/30/2023-NFD: 07/23/2023. This test claim was processed through [facility]- copay amounts may vary at other pharmacies due to pharmacy/plan contracts, or as the patient moves through the different stages of their insurance plan.   PA #/Case ID/Reference #: PA Case ID #: [id number]

## 2023-07-09 DIAGNOSIS — I10 Essential (primary) hypertension: Secondary | ICD-10-CM | POA: Diagnosis not present

## 2023-07-09 DIAGNOSIS — E785 Hyperlipidemia, unspecified: Secondary | ICD-10-CM | POA: Diagnosis not present

## 2023-07-09 DIAGNOSIS — E559 Vitamin D deficiency, unspecified: Secondary | ICD-10-CM | POA: Diagnosis not present

## 2023-07-09 DIAGNOSIS — K5903 Drug induced constipation: Secondary | ICD-10-CM | POA: Diagnosis not present

## 2023-07-31 ENCOUNTER — Other Ambulatory Visit: Payer: Self-pay | Admitting: Adult Health

## 2023-08-03 NOTE — Telephone Encounter (Signed)
Rx refilled per last telephone note.

## 2023-08-18 DIAGNOSIS — Z01419 Encounter for gynecological examination (general) (routine) without abnormal findings: Secondary | ICD-10-CM | POA: Diagnosis not present

## 2023-09-01 DIAGNOSIS — K5903 Drug induced constipation: Secondary | ICD-10-CM | POA: Diagnosis not present

## 2023-09-01 DIAGNOSIS — E785 Hyperlipidemia, unspecified: Secondary | ICD-10-CM | POA: Diagnosis not present

## 2023-09-01 DIAGNOSIS — E559 Vitamin D deficiency, unspecified: Secondary | ICD-10-CM | POA: Diagnosis not present

## 2023-09-01 DIAGNOSIS — I10 Essential (primary) hypertension: Secondary | ICD-10-CM | POA: Diagnosis not present

## 2023-09-30 DIAGNOSIS — Z7189 Other specified counseling: Secondary | ICD-10-CM | POA: Diagnosis not present

## 2023-09-30 DIAGNOSIS — L821 Other seborrheic keratosis: Secondary | ICD-10-CM | POA: Diagnosis not present

## 2023-09-30 DIAGNOSIS — L814 Other melanin hyperpigmentation: Secondary | ICD-10-CM | POA: Diagnosis not present

## 2023-09-30 DIAGNOSIS — D2362 Other benign neoplasm of skin of left upper limb, including shoulder: Secondary | ICD-10-CM | POA: Diagnosis not present

## 2023-09-30 DIAGNOSIS — D485 Neoplasm of uncertain behavior of skin: Secondary | ICD-10-CM | POA: Diagnosis not present

## 2023-09-30 DIAGNOSIS — D225 Melanocytic nevi of trunk: Secondary | ICD-10-CM | POA: Diagnosis not present

## 2023-09-30 DIAGNOSIS — D2371 Other benign neoplasm of skin of right lower limb, including hip: Secondary | ICD-10-CM | POA: Diagnosis not present

## 2023-10-09 ENCOUNTER — Other Ambulatory Visit: Payer: Self-pay | Admitting: Adult Health

## 2023-10-27 DIAGNOSIS — K5903 Drug induced constipation: Secondary | ICD-10-CM | POA: Diagnosis not present

## 2023-10-27 DIAGNOSIS — I10 Essential (primary) hypertension: Secondary | ICD-10-CM | POA: Diagnosis not present

## 2023-10-27 DIAGNOSIS — E785 Hyperlipidemia, unspecified: Secondary | ICD-10-CM | POA: Diagnosis not present

## 2023-10-27 DIAGNOSIS — E559 Vitamin D deficiency, unspecified: Secondary | ICD-10-CM | POA: Diagnosis not present

## 2023-10-29 ENCOUNTER — Telehealth: Payer: Self-pay | Admitting: Adult Health

## 2023-10-29 NOTE — Telephone Encounter (Signed)
error 

## 2023-12-06 ENCOUNTER — Other Ambulatory Visit: Payer: Self-pay | Admitting: Adult Health

## 2023-12-07 ENCOUNTER — Other Ambulatory Visit: Payer: Self-pay

## 2023-12-23 DIAGNOSIS — I10 Essential (primary) hypertension: Secondary | ICD-10-CM | POA: Diagnosis not present

## 2023-12-23 DIAGNOSIS — E785 Hyperlipidemia, unspecified: Secondary | ICD-10-CM | POA: Diagnosis not present

## 2023-12-23 DIAGNOSIS — E559 Vitamin D deficiency, unspecified: Secondary | ICD-10-CM | POA: Diagnosis not present

## 2023-12-23 DIAGNOSIS — Z79899 Other long term (current) drug therapy: Secondary | ICD-10-CM | POA: Diagnosis not present

## 2024-01-08 ENCOUNTER — Other Ambulatory Visit: Payer: Self-pay | Admitting: Adult Health

## 2024-01-11 ENCOUNTER — Other Ambulatory Visit: Payer: Self-pay

## 2024-01-12 ENCOUNTER — Encounter: Payer: Self-pay | Admitting: Adult Health

## 2024-01-12 ENCOUNTER — Ambulatory Visit: Payer: Medicaid Other | Admitting: Adult Health

## 2024-01-12 ENCOUNTER — Other Ambulatory Visit: Payer: Self-pay | Admitting: Adult Health

## 2024-01-12 VITALS — BP 129/85 | HR 80 | Ht 62.0 in | Wt 135.0 lb

## 2024-01-12 DIAGNOSIS — G43009 Migraine without aura, not intractable, without status migrainosus: Secondary | ICD-10-CM | POA: Diagnosis not present

## 2024-01-12 DIAGNOSIS — G4733 Obstructive sleep apnea (adult) (pediatric): Secondary | ICD-10-CM | POA: Diagnosis not present

## 2024-01-12 DIAGNOSIS — Z8249 Family history of ischemic heart disease and other diseases of the circulatory system: Secondary | ICD-10-CM | POA: Diagnosis not present

## 2024-01-12 DIAGNOSIS — R519 Headache, unspecified: Secondary | ICD-10-CM

## 2024-01-12 MED ORDER — QULIPTA 60 MG PO TABS: 1.0000 | ORAL_TABLET | Freq: Every day | ORAL | 3 refills | Status: DC

## 2024-01-12 MED ORDER — NURTEC 75 MG PO TBDP: ORAL_TABLET | ORAL | 5 refills | Status: AC

## 2024-01-12 MED ORDER — QULIPTA 60 MG PO TABS: 60.0000 mg | ORAL_TABLET | Freq: Every day | ORAL | 0 refills | Status: AC

## 2024-01-12 NOTE — Progress Notes (Signed)
 PATIENT: Ashley Horne DOB: May 01, 1976  REASON FOR VISIT: follow up HISTORY FROM: patient PRIMARY NEUROLOGIST: Previously Dr. Tilda Horne  HISTORY OF PRESENT ILLNESS: Today 01/12/24  Ashley Horne is a 48 y.o. female who has been followed in this office for Migraine headaches. Returns today for follow-up.  She reports overall her migraines are under good control.  She is having approximately 2-3 migraines a month.  She does have photophobia and phonophobia with her migraines.  Denies nausea.  States that she used to get an aura before her migraines but however she has not gotten an aura in quite some time.  Typically can use BC powder and her headache will resolve eventually.  She states several months ago she started having daily headaches primarily across the front of the forehead.  She often wakes up with these headaches.  She just was treated for a sinus infection.  She continues to have headaches.  She is most concerned about her headaches because she has noticed a change in her vision.  She states at times she has to blink several times per the image to be clear.  She has had to use readers now.  Also at random times she will feel light headache and like her tongue is very thick.  She would like to repeat MRI of the brain.  She also has family history-2 aunts who had brain aneurysms.  She would also like to have an MRI of the brain to evaluate.  She had both MRI and MRA in 2016 that was relatively unremarkable.  She does state that she takes over-the-counter medicine on most daily.  Her daily headache is more annoying than painful.  She was on CPAP but stopped it approximately 6 months ago.  She states that she was not on it long before she stopped it.  She was diagnosed in March 2024.  She states that she stopped it primarily because she was on Ozempic and had weight loss.  And was no longer snoring.    HISTORY 06/27/22: Ashley Horne is a 48 year old female with a history of migraine headaches.   She returns today for follow-up.  She is Currently on Emgality . Has taken it 3-4 months. Currently having 2-3 headaches but states that she was like taking her medicine about 2 weeks.  She uses Ubrelvy  as abortive therapy and that tends to work well for her.  She does state that she would be interested in trying a different therapy.  Reports that the Emgality  injections are painful.  Without taking Emgality  or any medication she would have a daily headache.   03/06/22: Ashley Horne is a 48 year old female with a history of migraine headaches. She returns today for follow-up.  Taking Ajovy  but has is back to 3 headaches a week. This has been going on for the last 3 months. Continue to use imitrex  with good benefit.  Using the oral imitrex - can take 1 hour to resolve. When she uses the injection it will resolve within 30 minutes. Doesnt take the injection while she is at work because it makes her jaw     03/05/21: Ashley Horne is a 48 year old female with a history of migraine headaches.  She returns today for follow-up.  She reports that her headaches have been under good control.  She is not even having 1 migraine a month.  She states that some months she does not have any.  She continues to use Imitrex  injections.  She does use the tablet if  she is at work.  Overall she feels that she is doing well.   09/04/20: Ashley Horne is a 48 year old female with a history of intractable migraine headaches.  She returns today for follow-up.  She remains on Ajovy  and Imitrex .  She states that in the last month to 2 months she has began having 1-2 headaches a week.  She does state that September and October she had a 6-week gap in taking Ajovy .  She reports that she continues to get benefit from Imitrex  injections.  She prefers the syringe over the autoinjector.   HISTORY 09/05/19 :   Ashley Horne is a 48 year old female with a history of intractable migraine headaches.  She returns today for follow-up.  She is currently  on Ajovy  and Imitrex .  She reports that this is working well.  She states that she does not even have 1 migraine a month.  She states that she typically can take Imitrex  injection and the headache will resolve within 30 minutes.  She denies any new symptoms.  She returns today for an evaluation.    REVIEW OF SYSTEMS: Out of a complete 14 system review of symptoms, the patient complains only of the following symptoms, and all other reviewed systems are negative.  ALLERGIES: No Known Allergies  HOME MEDICATIONS: Outpatient Medications Prior to Visit  Medication Sig Dispense Refill   ALPRAZolam (XANAX) 0.5 MG tablet Take 0.5 mg by mouth 3 (three) times daily as needed for anxiety.   0   Atogepant  (QULIPTA ) 60 MG TABS TAKE 1 TABLET BY MOUTH EVERY DAY 30 tablet 0   buPROPion (WELLBUTRIN SR) 150 MG 12 hr tablet Take 150 mg by mouth daily.     Cetirizine HCl (ALLERGY, CETIRIZINE, PO) Take by mouth daily.     ibuprofen  (ADVIL ,MOTRIN ) 200 MG tablet Take 200 mg by mouth every 6 (six) hours as needed for mild pain.     LINZESS 72 MCG capsule Take 72 mcg by mouth every morning.     Semaglutide, 2 MG/DOSE, (OZEMPIC, 2 MG/DOSE,) 8 MG/3ML SOPN Inject 0.5 mg into the skin once a week. 1 more dose before stopping     sertraline (ZOLOFT) 50 MG tablet Take 50 mg by mouth daily.     valACYclovir (VALTREX) 1000 MG tablet Take 1,000 mg by mouth as needed.     valsartan (DIOVAN) 160 MG tablet Take 160 mg by mouth daily.     Atogepant  (QULIPTA ) 60 MG TABS Take 1 tablet (60 mg total) by mouth daily. 28 tablet 0   Atogepant  (QULIPTA ) 60 MG TABS Take 1 tablet (60 mg total) by mouth daily. 4 tablet 0   Ubrogepant  (UBRELVY ) 100 MG TABS Take 1 tablet at the onset of migraine. Repeat in 2 hours if needed. (Patient not taking: Reported on 01/12/2024) 15 tablet 3   meclizine  (ANTIVERT ) 25 MG tablet Take 1 tablet (25 mg total) by mouth 3 (three) times daily as needed for dizziness. (Patient not taking: Reported on 01/12/2024)  30 tablet 0   predniSONE  (DELTASONE ) 5 MG tablet Begin taking 6 tablets daily, taper by one tablet daily until off the medication. (Patient not taking: Reported on 01/12/2024) 21 tablet 0   No facility-administered medications prior to visit.    PAST MEDICAL HISTORY: Past Medical History:  Diagnosis Date   Anxiety 08/24/2015   Calculus of kidney    Classic migraine 03/28/2015   Elevated blood pressure    Family history of cerebral aneurysm 03/28/2015   Headache  Herpes    Sleep apnea    moderate (high end), used cpap x 3 months, started ozempic & lost weight, stopped cpap    PAST SURGICAL HISTORY: Past Surgical History:  Procedure Laterality Date   ANTERIOR CRUCIATE LIGAMENT REPAIR     CESAREAN SECTION     CESAREAN SECTION  07/02/2012   Procedure: CESAREAN SECTION;  Surgeon: Shasta Deist, MD;  Location: [facility];  Service: Obstetrics;  Laterality: N/A;  EDD: 07/27/12/Repeat   LEEP      FAMILY HISTORY: Family History  Problem Relation Age of Onset   Aneurysm Maternal Aunt        cerebral   Migraines Maternal Aunt    Breast cancer Maternal Aunt    Migraines Mother    Migraines Sister    Aneurysm Maternal Aunt    Diabetes Paternal Uncle    Diabetes Maternal Grandmother    Melanoma Maternal Grandmother    Stroke Maternal Grandmother    Prostate cancer Paternal Grandfather    High blood pressure Paternal Grandmother     SOCIAL HISTORY: Social History   Socioeconomic History   Marital status: Married    Spouse name: Not on file   Number of children: 1   Years of education: BS   Highest education level: Not on file  Occupational History   Not on file  Tobacco Use   Smoking status: Never   Smokeless tobacco: Never  Vaping Use   Vaping status: Never Used  Substance and Sexual Activity   Alcohol use: Yes    Alcohol/week: 4.0 - 6.0 standard drinks of alcohol    Types: 4 - 6 Glasses of wine per week   Drug use: No   Sexual activity: Not on file  Other  Topics Concern   Not on file  Social History Narrative   Patient drinks 1-2 cups of caffeine daily.   Patient is right handed.   Social Drivers of Corporate investment banker Strain: Not on file  Food Insecurity: Not on file  Transportation Needs: Not on file  Physical Activity: Not on file  Stress: Not on file  Social Connections: Unknown (01/28/2022)   Received from [facility], [facility]   Social Network    Social Network: Not on file  Intimate Partner Violence: Unknown (12/20/2021)   [facility] from [facility], [facility]   HITS    Physically Hurt: Not on file    Insult or Talk Down To: Not on file    Threaten Physical Harm: Not on file    Scream or Curse: Not on file      PHYSICAL EXAM  Vitals:   01/12/24 1142  BP: 129/85  Pulse: 80  Weight: 135 lb (61.2 kg)  Height: 5\' 2"  (1.575 m)   Body mass index is 24.69 kg/m.  Generalized: Well developed, in no acute distress   Neurological examination  Mentation: Alert oriented to time, place, history taking. Follows all commands speech and language fluent Cranial nerve II-XII: Pupils were equal round reactive to light. Extraocular movements were full, visual field were full on confrontational test. Facial sensation and strength were normal. Uvula tongue midline. Head turning and shoulder shrug  were normal and symmetric. Motor: The motor testing reveals 5 over 5 strength of all 4 extremities. Good symmetric motor tone is noted throughout.  Sensory: Sensory testing is intact to soft touch on all 4 extremities. No evidence of extinction is noted.  Coordination: Cerebellar testing reveals good finger-nose-finger and heel-to-shin bilaterally.  Gait and station: Gait is normal. Tandem gait is normal. Romberg is negative. No drift is seen.  Reflexes: Deep tendon reflexes are symmetric and normal bilaterally.   DIAGNOSTIC DATA (LABS, IMAGING, TESTING) - I reviewed patient records, labs, notes, testing and imaging  myself where available.  Lab Results  Component Value Date   WBC 8.1 07/04/2021   HGB 14.1 07/04/2021   HCT 43.0 07/04/2021   MCV 86.0 07/04/2021   PLT 348 07/04/2021      Component Value Date/Time   NA 139 07/04/2021 1436   K 3.9 07/04/2021 1436   CL 106 07/04/2021 1436   CO2 25 07/04/2021 1436   GLUCOSE 95 07/04/2021 1436   BUN 11 07/04/2021 1436   CREATININE 0.75 07/04/2021 1436   CREATININE 0.81 06/22/2015 1414   CALCIUM 9.4 07/04/2021 1436   PROT 7.0 03/12/2015 2200   ALBUMIN 4.4 03/12/2015 2200   AST 16 03/12/2015 2200   ALT 14 03/12/2015 2200   ALKPHOS 67 03/12/2015 2200   BILITOT 0.2 (L) 03/12/2015 2200   GFRNONAA >60 07/04/2021 1436   GFRAA >60 03/12/2015 2200    Lab Results  Component Value Date   TSH 2.160 06/07/2018      ASSESSMENT AND PLAN 48 y.o. year old female  has a past medical history of Anxiety (08/24/2015), Calculus of kidney, Classic migraine (03/28/2015), Elevated blood pressure, Family history of cerebral aneurysm (03/28/2015), Headache, Herpes, and Sleep apnea. here with:  1.  Migraine headaches 2.  Daily headaches 3.  History of obstructive sleep apnea 4.  Family history of cerebral aneurysm  - Continue Qulipta  60 mg daily for migraine prevention - Try Nurtec at the onset of migraine. Only 1 tablet in 24 hours.  - Advised that she should limit her use of over-the-counter medication as this can cause rebound headaches. - Referral placed for sleep evaluation as I recommended possibly repeating home sleep test to evaluate her sleep apnea - MRI/MRA of the brain without contrast ordered to evaluate for daily headaches and cerebral aneurysm due to family history. Did consult with Dr. Gracie Lav about repeating imaging without contrast.  - I did educate the patient on stroke risk with migraine with aura if she is on estrogen. - FU in 6 months or sooner if needed.      Clem Currier, MSN, NP-C 01/12/2024, 11:43 AM [facility] [address] [place], [place] [address] [phone] '

## 2024-01-12 NOTE — Patient Instructions (Addendum)
 Your Plan:  Continue Qulipta  daily for prevention Try Nurtec 75 mg at the onset of a migraine MRI/MRA ordered Referral for sleep eval    Discussed:  There is increased risk for stroke in women with migraine with aura and a contraindication for the combined contraceptive pill for use by women who have migraine with aura. The risk for women with migraine without aura is lower. However other risk factors like smoking are far more likely to increase stroke risk than migraine. There is a recommendation for no smoking and for the use of OCPs without estrogen such as progestogen only pills particularly for women with migraine with aura.Aaron Aas People who have migraine headaches with auras may be 3 times more likely to have a stroke caused by a blood clot, compared to migraine patients who don't see auras. Women who take hormone-replacement therapy may be 30 percent more likely to suffer a clot-based stroke than women not taking medication containing estrogen. Other risk factors like smoking and high blood pressure may be  much more important. And stroke is still a rare complication due to migraine aura and is controversial and lower doses may not cause a risk.

## 2024-01-14 ENCOUNTER — Telehealth: Payer: Self-pay

## 2024-01-14 ENCOUNTER — Other Ambulatory Visit (HOSPITAL_COMMUNITY): Payer: Self-pay

## 2024-01-14 ENCOUNTER — Telehealth: Payer: Self-pay | Admitting: Adult Health

## 2024-01-14 NOTE — Telephone Encounter (Signed)
 Pharmacy Patient Advocate Encounter   Received notification from RX Request Messages that prior authorization for Nurtec 75MG  dispersible tablets is required/requested.   Insurance verification completed.   The patient is insured through [facility] .   Per test claim: PA required; PA submitted to above mentioned insurance via CoverMyMeds Key/confirmation #/EOC [id number] Status is pending

## 2024-01-14 NOTE — Telephone Encounter (Signed)
 MRI brain BCBS auth: [id number] exp. 01/14/24-02/12/24  MRA head BCBS auth: [id number] exp. 01/14/24-02/12/24 sent to GI [phone]

## 2024-01-15 ENCOUNTER — Other Ambulatory Visit (HOSPITAL_COMMUNITY): Payer: Self-pay

## 2024-01-15 NOTE — Telephone Encounter (Signed)
 Pharmacy Patient Advocate Encounter  Received notification from [facility] that Prior Authorization for Nurtec 75MG  dispersible tablets has been APPROVED from 01/14/2024 to 04/07/2024   PA #/Case ID/Reference #: PA Case ID #: [id number]

## 2024-01-19 ENCOUNTER — Other Ambulatory Visit: Payer: Self-pay | Admitting: *Deleted

## 2024-01-19 NOTE — Telephone Encounter (Signed)
 Needs PA for nurtec.  Has used ubrelby in past and stopped being as effective.

## 2024-01-20 ENCOUNTER — Other Ambulatory Visit (HOSPITAL_COMMUNITY): Payer: Self-pay

## 2024-01-20 NOTE — Telephone Encounter (Signed)
     PA is active-med was filled on 01/18/2024-next fill available on 02/10/2024

## 2024-01-21 DIAGNOSIS — E559 Vitamin D deficiency, unspecified: Secondary | ICD-10-CM | POA: Diagnosis not present

## 2024-01-21 DIAGNOSIS — Z79899 Other long term (current) drug therapy: Secondary | ICD-10-CM | POA: Diagnosis not present

## 2024-01-21 DIAGNOSIS — F419 Anxiety disorder, unspecified: Secondary | ICD-10-CM | POA: Diagnosis not present

## 2024-01-21 DIAGNOSIS — I10 Essential (primary) hypertension: Secondary | ICD-10-CM | POA: Diagnosis not present

## 2024-01-26 DIAGNOSIS — J019 Acute sinusitis, unspecified: Secondary | ICD-10-CM | POA: Diagnosis not present

## 2024-01-28 ENCOUNTER — Ambulatory Visit
Admission: RE | Admit: 2024-01-28 | Discharge: 2024-01-28 | Disposition: A | Discharge status: Home / Self Care | Source: Ambulatory Visit | Attending: Adult Health | Admitting: Adult Health

## 2024-01-28 DIAGNOSIS — G43009 Migraine without aura, not intractable, without status migrainosus: Secondary | ICD-10-CM

## 2024-01-28 DIAGNOSIS — R519 Headache, unspecified: Secondary | ICD-10-CM

## 2024-01-28 DIAGNOSIS — Z8249 Family history of ischemic heart disease and other diseases of the circulatory system: Secondary | ICD-10-CM | POA: Diagnosis not present

## 2024-01-31 ENCOUNTER — Ambulatory Visit: Payer: Self-pay | Admitting: Adult Health

## 2024-02-15 ENCOUNTER — Ambulatory Visit (INDEPENDENT_AMBULATORY_CARE_PROVIDER_SITE_OTHER): Admitting: *Deleted

## 2024-02-15 ENCOUNTER — Encounter: Payer: Self-pay | Admitting: Adult Health

## 2024-02-15 VITALS — BP 120/82 | HR 89

## 2024-02-15 DIAGNOSIS — G43009 Migraine without aura, not intractable, without status migrainosus: Secondary | ICD-10-CM

## 2024-02-15 MED ORDER — KETOROLAC TROMETHAMINE 60 MG/2ML IM SOLN
30.0000 mg | Freq: Once | INTRAMUSCULAR | Status: AC
  Administered 2024-02-15: 30 mg via INTRAMUSCULAR

## 2024-02-15 NOTE — Progress Notes (Signed)
 Per Megan,NP please give patient Toradol  injection (30 mg) Sandy,RN witnessed wasting 30 mg of a 60mg  vial. Gave injection placed bandade on injection site. Patient waited for 10 minutes to make sure no reaction to medication Pt went to check out

## 2024-02-15 NOTE — Telephone Encounter (Signed)
 We could do Toradol  30 mg IM.  It does not appear that she has any diagnosis of kidney disease.  Please check with the patient first.  If she does not want to do this we could consider prednisone  please ask if she is ever had a prednisone  Dosepak.  And if she is diabetic?

## 2024-02-15 NOTE — Telephone Encounter (Signed)
 Maximo Spar to [facility] (supporting Clem Currier, NP) (Selected Message)     02/15/24  1:32 PM The pain has varied since Friday. Has stayed at least a 3or 4 and has gone up to a 7or 8. I have not tried toradol , I feel like I have done prednisone .

## 2024-02-15 NOTE — Telephone Encounter (Signed)
 There was another encounter where staff was discussing with patient. I messaged the patient back in that encounter.

## 2024-02-15 NOTE — Telephone Encounter (Addendum)
 I called the patient to check on her.  She states the Toradol  has already started helping.  Reports that her headache is already much better.  She is hoping it relieves her headache completely.  I did advise that if her headache does not improve she should let us  know and we will discuss different options.

## 2024-02-17 NOTE — Telephone Encounter (Signed)
 Duplicate message. Already sent other one to [facility] NP.

## 2024-02-18 NOTE — Telephone Encounter (Addendum)
 I called the patient.  She received a prednisone  Dosepak at the end of April.  After talking with her she does not think it was that beneficial.  The Toradol  shot was beneficial but only lasted for 24 to 48 hours.  She is back to having daily headaches.  We have already completed an MRI and an MRA that was relatively unremarkable.  The patient is meeting with the sleep doctor next week.  Perhaps some of her daily headaches are from untreated sleep apnea?  I did advise the patient that if she wanted to go to urgent care they may be able to do a migraine cocktail.  Our infusion suite is currently closed.  She voiced understanding.  He states that she will stop by an urgent care this afternoon.  She will update me again tomorrow.  Also advised the patient we may need to consider switching Qulipta  if she continues to have frequent headaches

## 2024-02-19 NOTE — Telephone Encounter (Signed)
 I called the patient.  She did not go to urgent care last night.  She states that her headache has overall been fairly mild she just cannot get rid of it.  The worst that it has been is approximately is 6 on the pain scale.  She states that these are her typical type of headaches.  No new symptoms.  She states that it was feels like a tension type headache with pain on the top of her head.  She has Nurtec but has not taken it since Saturday.  I advised that she should try taking Nurtec daily for the next 4 days to see if this will break the cycle of her headaches.  If not she will give me a call back and we will discuss other medication options.  I again reiterated that if she develops new symptoms or her headaches become severe she should always seek care at the nearest ED or urgent care.

## 2024-02-23 DIAGNOSIS — G4733 Obstructive sleep apnea (adult) (pediatric): Secondary | ICD-10-CM | POA: Diagnosis not present

## 2024-02-24 DIAGNOSIS — E559 Vitamin D deficiency, unspecified: Secondary | ICD-10-CM | POA: Diagnosis not present

## 2024-02-24 DIAGNOSIS — E785 Hyperlipidemia, unspecified: Secondary | ICD-10-CM | POA: Diagnosis not present

## 2024-02-24 DIAGNOSIS — I10 Essential (primary) hypertension: Secondary | ICD-10-CM | POA: Diagnosis not present

## 2024-02-24 DIAGNOSIS — F419 Anxiety disorder, unspecified: Secondary | ICD-10-CM | POA: Diagnosis not present

## 2024-03-09 DIAGNOSIS — G4733 Obstructive sleep apnea (adult) (pediatric): Secondary | ICD-10-CM | POA: Diagnosis not present

## 2024-04-13 DIAGNOSIS — G4733 Obstructive sleep apnea (adult) (pediatric): Secondary | ICD-10-CM | POA: Diagnosis not present

## 2024-04-18 ENCOUNTER — Other Ambulatory Visit: Payer: Self-pay | Admitting: Adult Health

## 2024-04-21 ENCOUNTER — Other Ambulatory Visit (HOSPITAL_COMMUNITY): Payer: Self-pay

## 2024-04-21 ENCOUNTER — Telehealth: Payer: Self-pay

## 2024-04-21 NOTE — Progress Notes (Signed)
 Patient is doing well with Nurtec. Has found it to be beneficial for her migraines. See separate telephone note.

## 2024-04-21 NOTE — Telephone Encounter (Signed)
 It is time to renew the PA for Nurtec-PT has not been evaluated since starting Nurtec. Insurance will need documentation answering the question below. Thanks.

## 2024-04-22 NOTE — Telephone Encounter (Signed)
 Pharmacy Patient Advocate Encounter   Received notification from CoverMyMeds that prior authorization for Nurtec 75MG  dispersible tablets is required/requested.   Insurance verification completed.   The patient is insured through [facility] .   Per test claim: PA required; PA submitted to above mentioned insurance via CoverMyMeds Key/confirmation #/EOC [id number] Status is pending

## 2024-04-26 NOTE — Telephone Encounter (Signed)
 Pharmacy Patient Advocate Encounter  Received notification from [facility] that Prior Authorization for Nurtec 75MG  dispersible tablets has been APPROVED from 04/22/2024 to 04/22/2025   PA #/Case ID/Reference #: PA Case ID #: [id number]

## 2024-05-04 DIAGNOSIS — E785 Hyperlipidemia, unspecified: Secondary | ICD-10-CM | POA: Diagnosis not present

## 2024-05-04 DIAGNOSIS — I10 Essential (primary) hypertension: Secondary | ICD-10-CM | POA: Diagnosis not present

## 2024-05-04 DIAGNOSIS — E559 Vitamin D deficiency, unspecified: Secondary | ICD-10-CM | POA: Diagnosis not present

## 2024-05-04 DIAGNOSIS — F419 Anxiety disorder, unspecified: Secondary | ICD-10-CM | POA: Diagnosis not present

## 2024-06-27 ENCOUNTER — Telehealth: Payer: Self-pay | Admitting: Pharmacist

## 2024-06-27 NOTE — Telephone Encounter (Signed)
 Pharmacy Patient Advocate Encounter   Received notification from CoverMyMeds that prior authorization for Qulipta  60MG  tablets is required/requested.   Insurance verification completed.   The patient is insured through [facility].   Per test claim: PA required; PA submitted to above mentioned insurance via Latent Key/confirmation #/EOC [id number] Status is pending

## 2024-06-27 NOTE — Telephone Encounter (Signed)
 Pharmacy Patient Advocate Encounter  Received notification from [facility] that Prior Authorization for QULIPTA  60 MG PO TABS has been APPROVED from 06/27/2024 to 06/27/2025   PA #/Case ID/Reference #: [id number]

## 2024-06-29 DIAGNOSIS — I10 Essential (primary) hypertension: Secondary | ICD-10-CM | POA: Diagnosis not present

## 2024-06-29 DIAGNOSIS — F419 Anxiety disorder, unspecified: Secondary | ICD-10-CM | POA: Diagnosis not present

## 2024-06-29 DIAGNOSIS — E559 Vitamin D deficiency, unspecified: Secondary | ICD-10-CM | POA: Diagnosis not present

## 2024-06-29 DIAGNOSIS — E785 Hyperlipidemia, unspecified: Secondary | ICD-10-CM | POA: Diagnosis not present

## 2024-07-13 ENCOUNTER — Ambulatory Visit: Admitting: Neurology

## 2024-07-13 ENCOUNTER — Encounter: Payer: Self-pay | Admitting: Neurology

## 2024-07-13 VITALS — BP 117/82 | HR 89 | Ht 62.0 in | Wt 136.6 lb

## 2024-07-13 DIAGNOSIS — G43009 Migraine without aura, not intractable, without status migrainosus: Secondary | ICD-10-CM | POA: Diagnosis not present

## 2024-07-13 DIAGNOSIS — R002 Palpitations: Secondary | ICD-10-CM | POA: Diagnosis not present

## 2024-07-13 DIAGNOSIS — G4733 Obstructive sleep apnea (adult) (pediatric): Secondary | ICD-10-CM

## 2024-07-13 NOTE — Patient Instructions (Addendum)
 We may consider you for Botox injections for your chronic migraines.  In the meantime, please continue to take Nurtec as needed and Qulipta  daily. Be cautious with the use of over-the-counter Advil /Motrin , due to risk to kidneys, high blood pressure, heartburn.  Please talk to your PCP or weight management provider about your recent palpitation. It may be due to coming off Ozempic and/or blood sugar fluctuation.  Increase your water intake to 64 oz/day.  Keep us  posted as to how you are doing with the oral appliance for your sleep apnea, as treatment of your sleep apnea may also improve your headaches.

## 2024-07-13 NOTE — Progress Notes (Signed)
 Subjective:    Patient ID: Ashley Horne is a 48 y.o. female.  HPI    True Mar, MD, PhD [facility] [address], [address] [address] [place], [place] [address]  Ms. Ashley Horne is a 48 year old female with an underlying medical history of migraine headaches, sleep apnea (followed by [facility]), kidney stone, and anxiety, who presents for follow-up consultation of her chronic migraines.  The patient is unaccompanied today.  She has been followed in this clinic previously by Dr. Jenel who has retired and has seen the nurse practitioner in our office on a regular basis, typically yearly.  I copied older notes below for reference.  I reviewed the last few visit notes as well.  She was last seen by Duwaine Russell, NP in April 2025, at which time she was recently treated with antibiotics for a sinus infection.  She reported a family history of brain aneurysm.  She had an MRI of the brain without contrast and MR angiogram of the head without contrast in the interim.  Today, 07/13/2024 (all dictated new, as well as above notes, some dictation done in note pad or Word, outside of chart, may appear as copied):   She reports doing fairly well.  She had about 3 migraines last month.  She is currently on Nurtec 75 mg as needed and Qulipta  daily for prevention, 60 mg strength.  Overall, this regimen works fairly well for her.  Triggers for her migraines could include stress.  She is trying to hydrate well but could do better.  She limits her caffeine.  She limits over-the-counter medication use.  She works as a psychologist, prison and probation services.  She is not quite up-to-date with her eye exam, has seen her eye doctor over a year ago and was planning to make an appointment.  She has prescription reading glasses but also over-the-counter readers at work just as well.  Recently she had an episode of palpitation, she was at a ball game and she just did not feel well, she had some tachycardia.   She had no chest pain and symptoms resolved after few minutes and she did not seek further workup with her PCP but is encouraged to do so.  Of note, she recently stopped her Ozempic about 6 weeks ago and while at the ballgame she did have some chips and a soda which may have helped.  For her sleep apnea, she is seeing [facility], she is currently not using a PAP therapy machine but is pursuing an oral appliance.  Has not been fitted with an oral appliance yet.  She feels that she sleeps with a lot of tension.  She has tried different medications over the course of time.  Medications previously tried include Topamax , Imitrex , Ubrelvy , Maxalt , propranolol , ondansetron , naproxen , Toradol , ibuprofen , Ajovy  injections, Emgality  injections, Prozac, Relpax , Wellbutrin.  She had a brain MRI without contrast on 01/28/2024 and I reviewed the results:  IMPRESSION:    Normal MRI brain (without).   In addition, I personally and independently reviewed images through the PACS system.  She had an MRA of the head without contrast on 01/28/2024 and I reviewed the results:    IMPRESSION:    MRA head without contrast.    Previously:  01/12/2024 Ashley Russell, NP): <<Ashley Horne is a 48 y.o. female who has been followed in this office for Migraine headaches. Returns today for follow-up.  She reports overall her migraines are under good control.  She is having approximately 2-3  migraines a month.  She does have photophobia and phonophobia with her migraines.  Denies nausea.  States that she used to get an aura before her migraines but however she has not gotten an aura in quite some time.  Typically can use BC powder and her headache will resolve eventually.  She states several months ago she started having daily headaches primarily across the front of the forehead.  She often wakes up with these headaches.  She just was treated for a sinus infection.  She continues to have headaches.  She is most concerned  about her headaches because she has noticed a change in her vision.  She states at times she has to blink several times per the image to be clear.  She has had to use readers now.  Also at random times she will feel light headache and like her tongue is very thick.  She would like to repeat MRI of the brain.  She also has family history-2 aunts who had brain aneurysms.  She would also like to have an MRI of the brain to evaluate.  She had both MRI and MRA in 2016 that was relatively unremarkable.  She does state that she takes over-the-counter medicine on most daily.  Her daily headache is more annoying than painful.  She was on CPAP but stopped it approximately 6 months ago.  She states that she was not on it long before she stopped it.  She was diagnosed in March 2024.  She states that she stopped it primarily because she was on Ozempic and had weight loss.  And was no longer snoring.>>       06/27/22 (VV, Duwaine Russell, NP): <<Ms. Ashley Horne is a 48 year old female with a history of migraine headaches.  She returns today for follow-up.  She is Currently on Emgality . Has taken it 3-4 months. Currently having 2-3 headaches but states that she was like taking her medicine about 2 weeks.  She uses Ubrelvy  as abortive therapy and that tends to work well for her.  She does state that she would be interested in trying a different therapy.  Reports that the Emgality  injections are painful.  Without taking Emgality  or any medication she would have a daily headache.>>   03/06/22 (VV, MM): <<Ms. Ashley Horne is a 48 year old female with a history of migraine headaches. She returns today for follow-up.  Taking Ajovy  but has is back to 3 headaches a week. This has been going on for the last 3 months. Continue to use imitrex  with good benefit.  Using the oral imitrex - can take 1 hour to resolve. When she uses the injection it will resolve within 30 minutes. Doesnt take the injection while she is at work because it makes her jaw   >>   03/05/21 (Video Visit, MM): <<Ms. Ashley Horne is a 48 year old female with a history of migraine headaches.  She returns today for follow-up.  She reports that her headaches have been under good control.  She is not even having 1 migraine a month.  She states that some months she does not have any.  She continues to use Imitrex  injections.  She does use the tablet if she is at work.  Overall she feels that she is doing well.>>   09/04/20 (VV, MM): <<Ms. Lacson is a 48 year old female with a history of intractable migraine headaches.  She returns today for follow-up.  She remains on Ajovy  and Imitrex .  She states that in the last month to 2  months she has began having 1-2 headaches a week.  She does state that September and October she had a 6-week gap in taking Ajovy .  She reports that she continues to get benefit from Imitrex  injections.  She prefers the syringe over the autoinjector.>>   09/05/19 (MM):<< Ms. Offutt is a 48 year old female with a history of intractable migraine headaches.  She returns today for follow-up.  She is currently on Ajovy  and Imitrex .  She reports that this is working well.  She states that she does not even have 1 migraine a month.  She states that she typically can take Imitrex  injection and the headache will resolve within 30 minutes.  She denies any new symptoms.  She returns today for an evaluation. >>  Her Past Medical History Is Significant For: Past Medical History:  Diagnosis Date   Anxiety 08/24/2015   Calculus of kidney    Classic migraine 03/28/2015   Elevated blood pressure    Family history of cerebral aneurysm 03/28/2015   Headache    Herpes    Sleep apnea 2024   moderate (high end), used cpap x 3 months, started ozempic & lost weight, stopped cpap    Her Past Surgical History Is Significant For: Past Surgical History:  Procedure Laterality Date   ANTERIOR CRUCIATE LIGAMENT REPAIR     CESAREAN SECTION     CESAREAN SECTION  07/02/2012    Procedure: CESAREAN SECTION;  Surgeon: Robbi JONELLE Render, MD;  Location: [facility];  Service: Obstetrics;  Laterality: N/A;  EDD: 07/27/12/Repeat   LEEP      Her Family History Is Significant For: Family History  Problem Relation Age of Onset   Aneurysm Maternal Aunt        cerebral   Migraines Maternal Aunt    Breast cancer Maternal Aunt    Migraines Mother    Migraines Sister    Aneurysm Maternal Aunt    Diabetes Paternal Uncle    Diabetes Maternal Grandmother    Melanoma Maternal Grandmother    Stroke Maternal Grandmother    Prostate cancer Paternal Grandfather    High blood pressure Paternal Grandmother     Her Social History Is Significant For: Social History   Socioeconomic History   Marital status: Married    Spouse name: Not on file   Number of children: 1   Years of education: BS   Highest education level: Not on file  Occupational History   Not on file  Tobacco Use   Smoking status: Never   Smokeless tobacco: Never  Vaping Use   Vaping status: Never Used  Substance and Sexual Activity   Alcohol use: Yes    Alcohol/week: 2.0 standard drinks of alcohol    Types: 2 Glasses of wine per week   Drug use: No   Sexual activity: Not on file  Other Topics Concern   Not on file  Social History Narrative   Patient drinks 1-2 cups of caffeine daily.   Patient is right handed.   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     Atogepant  (QULIPTA ) 60 MG TABS Take 1 tablet (60 mg total) by mouth daily.   buPROPion (WELLBUTRIN SR) 150 MG 12 hr tablet Take 150 mg by mouth daily.   ibuprofen  (ADVIL ,MOTRIN ) 200 MG tablet Take 200 mg by mouth every 6 (six) hours as needed for mild pain.   LINZESS 72 MCG capsule Take 72 mcg by mouth every morning. (Patient taking differently: Take 72 mcg by mouth as needed.)   Rimegepant Sulfate (NURTEC) 75 MG TBDP Take 1 tablet at the onset of migraine. Only 1 tab in 24 hours.   sertraline (ZOLOFT) 50 MG tablet Take 50 mg by mouth daily.   valACYclovir (VALTREX) 1000 MG tablet Take 1,000 mg by mouth as needed.   valsartan (DIOVAN) 160 MG tablet Take 160 mg by mouth daily.   Cetirizine HCl (ALLERGY, CETIRIZINE, PO) Take by mouth daily.   QULIPTA  60 MG TABS TAKE 1 TABLET BY MOUTH EVERY DAY   Semaglutide, 2 MG/DOSE, (OZEMPIC, 2 MG/DOSE,) 8 MG/3ML SOPN Inject 0.5 mg into the skin once a week. 1 more dose before stopping   No facility-administered encounter medications on file as of 07/13/2024.  :   Review of Systems:  Out of a complete 14 point review of systems, all are reviewed and negative with the exception of these symptoms as listed below:  Review of Systems  Objective:  Neurological Exam  Physical Exam Physical Examination:   Vitals:   07/13/24 1253  BP: 117/82  Pulse: 89    General Examination: The patient is a very pleasant 48 y.o. female in no acute distress. She appears well-developed and well-nourished and well groomed.   HEENT: Normocephalic, atraumatic, pupils are equal, round and reactive to light, extraocular tracking is good without limitation to gaze excursion or nystagmus noted. no photophobia, funduscopic exam benign. no Corrective eye glasses in place. Hearing is grossly intact.  Face is symmetric with normal facial animation and normal facial sensation. Speech is clear without dysarthria. There is no hypophonia. There is no lip, neck/head, jaw or voice  tremor. Neck is supple with full range of passive and active motion. There are no carotid bruits on auscultation.  Airway/Oropharynx exam reveals: mild mouth dryness, good dental hygiene and mild airway crowding, tongue protrudes centrally and palate elevates symmetrically.   Chest: Clear to auscultation without wheezing, rhonchi or crackles noted.  Heart: S1+S2+0, regular and normal without murmurs, rubs or gallops noted.   Abdomen: Soft, non-tender and non-distended.  Extremities: There is no pitting edema in the distal lower extremities bilaterally.   Skin: Warm and dry without trophic changes noted.   Musculoskeletal: exam reveals no obvious joint deformities.   Neurologically:  Mental status: The patient is awake, alert and oriented in all 4 spheres. Her immediate and remote memory, attention, language skills and fund of knowledge are appropriate. There is no evidence of aphasia, agnosia, apraxia or anomia. Speech is clear with normal prosody and enunciation. Thought process is linear. Mood is normal and affect is normal.  Cranial nerves II - XII are as described above under HEENT exam.  Motor exam: Normal bulk, strength and tone is noted. There is no obvious action or resting tremor.  Fine motor skills and coordination: Intact finger taps, hand movements and rapid alternating patting with both upper extremities, normal  foot taps in the lower extremities.  Cerebellar testing: No dysmetria or intention tremor. There is no truncal or gait ataxia.  Normal finger-to-nose, normal heel to shin bilaterally. Sensory exam: intact to light touch in the upper and lower extremities.  Reflexes 2+ throughout, toes are downgoing bilaterally. Romberg negative. Tandem walk normal. Gait, station and balance: She stands easily. No veering to one side is noted. No leaning to one side is noted. Posture is age-appropriate and stance is narrow based. Gait shows normal stride length and normal pace. No  problems turning are noted.   Assessment and plan:  In summary, Jolly Carlini is a very pleasant 48 year old female with an underlying medical history of migraine headaches, sleep apnea (followed by [facility]), kidney stone, and anxiety, who presents for follow-up consultation of her chronic migraines. Neurological exam is nonfocal, recent scans including brain MRI and MR angiogram were benign back in May 2025.  We talked about prevention and acute management options, mutually agreed to maintain her current regimen and possibly consider another injectable such as Aimovig  or Botox injections down the road.  She is advised to stay well-hydrated with water and increase her water intake.  She is advised to talk to her PCP about her palpitation recently.  She has no symptoms currently.  It may tie in with blood sugar fluctuation after stopping Ozempic. She is advised to keep us  posted as to how she is doing with the oral appliance for her sleep apnea as treatment of sleep apnea may also improve her headaches, hopefully. Below is a summary of my recommendations and our discussion points from today's visit, based on chart review, history and examination. They were given these instructions verbally during the visit in detail and also in writing in the MyChart after visit summary (AVS), which they can access electronically. << We may consider you for Botox injections for your chronic migraines.  In the meantime, please continue to take Nurtec as needed and Qulipta  daily. Be cautious with the use of over-the-counter Advil /Motrin , due to risk to kidneys, high blood pressure, heartburn.  Please talk to your PCP or weight management provider about your recent palpitation. It may be due to coming off Ozempic and/or blood sugar fluctuation.  Increase your water intake to 64 oz/day.  Keep us  posted as to how you are doing with the oral appliance for your sleep apnea, as treatment of your sleep apnea may also  improve your headaches.   >>   She is advised to follow-up routinely in this clinic to see the nurse practitioner in about a year, sooner if needed.  I answered all her questions today and she was in agreement with our plan. I spent 45 minutes in total face-to-face time and in reviewing records during pre-charting, more than 50% of which was spent in counseling and coordination of care, reviewing test results, reviewing medications and treatment regimen and/or in discussing or reviewing the diagnosis of chronic migraines, sleep apnea, palpitations, the prognosis and treatment options. Pertinent laboratory and imaging test results that were available during this visit with the patient were reviewed by me and considered in my medical decision making (see chart for details).

## 2024-07-25 DIAGNOSIS — E559 Vitamin D deficiency, unspecified: Secondary | ICD-10-CM | POA: Diagnosis not present

## 2024-07-25 DIAGNOSIS — F419 Anxiety disorder, unspecified: Secondary | ICD-10-CM | POA: Diagnosis not present

## 2024-07-25 DIAGNOSIS — I10 Essential (primary) hypertension: Secondary | ICD-10-CM | POA: Diagnosis not present

## 2024-07-25 DIAGNOSIS — Z1331 Encounter for screening for depression: Secondary | ICD-10-CM | POA: Diagnosis not present

## 2024-07-25 DIAGNOSIS — E785 Hyperlipidemia, unspecified: Secondary | ICD-10-CM | POA: Diagnosis not present

## 2024-07-27 DIAGNOSIS — R41840 Attention and concentration deficit: Secondary | ICD-10-CM | POA: Diagnosis not present

## 2024-08-04 DIAGNOSIS — F331 Major depressive disorder, recurrent, moderate: Secondary | ICD-10-CM | POA: Diagnosis not present

## 2024-08-04 DIAGNOSIS — F902 Attention-deficit hyperactivity disorder, combined type: Secondary | ICD-10-CM | POA: Diagnosis not present

## 2024-08-22 DIAGNOSIS — F331 Major depressive disorder, recurrent, moderate: Secondary | ICD-10-CM | POA: Diagnosis not present

## 2024-08-22 DIAGNOSIS — F902 Attention-deficit hyperactivity disorder, combined type: Secondary | ICD-10-CM | POA: Diagnosis not present

## 2024-08-30 DIAGNOSIS — N912 Amenorrhea, unspecified: Secondary | ICD-10-CM | POA: Diagnosis not present

## 2024-08-30 DIAGNOSIS — Z01419 Encounter for gynecological examination (general) (routine) without abnormal findings: Secondary | ICD-10-CM | POA: Diagnosis not present

## 2024-10-17 ENCOUNTER — Other Ambulatory Visit: Payer: Self-pay | Admitting: Family Medicine

## 2024-10-17 DIAGNOSIS — R202 Paresthesia of skin: Secondary | ICD-10-CM

## 2024-10-17 DIAGNOSIS — R200 Anesthesia of skin: Secondary | ICD-10-CM

## 2025-07-20 ENCOUNTER — Telehealth: Admitting: Adult Health
